# Patient Record
Sex: Female | Born: 1975
Health system: Southern US, Community
[De-identification: ages and names within clinical notes are randomized; demographics above are authoritative.]

## PROBLEM LIST (undated history)

## (undated) DIAGNOSIS — F1910 Other psychoactive substance abuse, uncomplicated: Secondary | ICD-10-CM

## (undated) DIAGNOSIS — M7989 Other specified soft tissue disorders: Secondary | ICD-10-CM

## (undated) DIAGNOSIS — Z8489 Family history of other specified conditions: Secondary | ICD-10-CM

## (undated) DIAGNOSIS — S02609A Fracture of mandible, unspecified, initial encounter for closed fracture: Secondary | ICD-10-CM

## (undated) HISTORY — PX: MANDIBLE FRACTURE SURGERY: SHX706

## (undated) HISTORY — DX: Fracture of mandible, unspecified, initial encounter for closed fracture: S02.609A

---

## 2004-02-10 ENCOUNTER — Emergency Department (HOSPITAL_COMMUNITY): Admission: EM | Admit: 2004-02-10 | Discharge: 2004-02-10 | Payer: Self-pay | Admitting: Emergency Medicine

## 2008-05-18 ENCOUNTER — Emergency Department: Payer: Self-pay | Admitting: Emergency Medicine

## 2008-10-04 ENCOUNTER — Emergency Department: Payer: Self-pay | Admitting: Emergency Medicine

## 2009-05-06 ENCOUNTER — Emergency Department: Payer: Self-pay | Admitting: Emergency Medicine

## 2009-12-13 ENCOUNTER — Inpatient Hospital Stay: Payer: Self-pay | Admitting: Obstetrics and Gynecology

## 2010-01-04 ENCOUNTER — Ambulatory Visit: Payer: Self-pay | Admitting: Specialist

## 2011-03-04 ENCOUNTER — Emergency Department: Payer: Self-pay | Admitting: Emergency Medicine

## 2015-04-28 ENCOUNTER — Emergency Department (HOSPITAL_COMMUNITY)
Admission: EM | Admit: 2015-04-28 | Discharge: 2015-04-28 | Discharge status: Against Medical Advice | Payer: 59 | Attending: Emergency Medicine | Admitting: Emergency Medicine

## 2015-04-28 ENCOUNTER — Encounter (HOSPITAL_COMMUNITY): Payer: Self-pay | Admitting: *Deleted

## 2015-04-28 ENCOUNTER — Emergency Department (HOSPITAL_COMMUNITY): Payer: 59

## 2015-04-28 DIAGNOSIS — F17200 Nicotine dependence, unspecified, uncomplicated: Secondary | ICD-10-CM | POA: Diagnosis not present

## 2015-04-28 DIAGNOSIS — R0600 Dyspnea, unspecified: Secondary | ICD-10-CM | POA: Diagnosis not present

## 2015-04-28 DIAGNOSIS — R0602 Shortness of breath: Secondary | ICD-10-CM | POA: Diagnosis present

## 2015-04-28 HISTORY — DX: Other specified soft tissue disorders: M79.89

## 2015-04-28 NOTE — ED Notes (Signed)
Pt requesting discharge, stating "I don't need blood, I think this is more related to my anxiety. Can we be given referral papers for family services/mental health services?" Will discuss w/ EDP

## 2015-04-28 NOTE — ED Notes (Signed)
Pt states yesterday was really stressful and has a lot of things going on.  PT feels sob, chest tightness, and racing heart rate.  Pt states just feels like she cannot breath, no distress

## 2015-04-28 NOTE — Discharge Instructions (Signed)
°Emergency Department Resource Guide °1) Find a Doctor and Pay Out of Pocket °Although you won't have to find out who is covered by your insurance plan, it is a good idea to ask around and get recommendations. You will then need to call the office and see if the doctor you have chosen will accept you as a new patient and what types of options they offer for patients who are self-pay. Some doctors offer discounts or will set up payment plans for their patients who do not have insurance, but you will need to ask so you aren't surprised when you get to your appointment. ° °2) Contact Your Local Health Department °Not all health departments have doctors that can see patients for sick visits, but many do, so it is worth a call to see if yours does. If you don't know where your local health department is, you can check in your phone book. The CDC also has a tool to help you locate your state's health department, and many state websites also have listings of all of their local health departments. ° °3) Find a Walk-in Clinic °If your illness is not likely to be very severe or complicated, you may want to try a walk in clinic. These are popping up all over the country in pharmacies, drugstores, and shopping centers. They're usually staffed by nurse practitioners or physician assistants that have been trained to treat common illnesses and complaints. They're usually fairly quick and inexpensive. However, if you have serious medical issues or chronic medical problems, these are probably not your best option. ° °No Primary Care Doctor: °- Call [facility] at  832-8000 - they can help you locate a primary care doctor that  accepts your insurance, provides certain services, etc. °- Physician Referral Service- 1-800-533-3463 ° °Chronic Pain Problems: °Organization         Address  Phone   Notes  ° [facility]  (336) 297-2271 Patients need to be referred by their primary care doctor.  ° °Medication  Assistance: °Organization         Address  Phone   Notes  °[place] [facility] [address] °[place], [place] [address] (336) 641-8030 --Must be a resident of [place] °-- Must have NO insurance coverage whatsoever (no Medicaid/ Medicare, etc.) °-- The pt. MUST have a primary care doctor that directs their care regularly and follows them in the community °  °[facility]  (866) 331-1348   °[employer]  (888) 892-1162   ° °Agencies that provide inexpensive medical care: °Organization         Address  Phone   Notes  °[facility]  (336) 832-8035   °[facility]    (336) 832-7272   °[facility] [address] °[place], [place] [address] (336) 832-4777   °[facility] [address], °[place] (336) 271-4999   °[facility]    (336) 373-0678   °[facility]    (336) 272-1050   °[facility] ° [address], [place] Phone:  (336) 832-4444, Fax:  (336) 832-4440 Hours of Operation:  9 am - 6 pm, M-F.  Also accepts Medicaid/Medicare and self-pay.  °[facility] ° [address], [place] Phone: (336) 832-3150, Fax: (336) 832-3151. Hours of Operation:  8:30 am - 5:30 pm, M-F.  Also accepts Medicaid and self-pay.  °[facility] [place] [address], [place] Phone: (336) 878-6027   °[facility] [address], [place], [place] (336)723-1848, Ext. 123 Mondays & Thursdays: 7-9 AM.  First 15 patients are seen on a first come, first serve basis. °  ° °Medicaid-accepting [place] Providers: ° °Organization         Address  Phone   Notes  °[facility] [address], [place] (336) 641-2100 Also accepts self-pay patients.  °[facility] [address], [place] ° (336) 856-9996   °[facility] [address], [place]  (336) 288-8857   °[facility] [address] [place] [address], [place] (336) 299-7000   °Veita Bland [address], [place]  ° (336) 373-1557 Only accepts [place] Access Medicaid patients after they have their name applied to their card.  ° °Self-Pay (no insurance) in [place]: ° °Organization         Address  Phone   Notes  °Sickle Cell Patients, [facility] [address], [place] (336) 832-1970   °[facility] [address], [place] (336) 832-4400   °[facility] [place] ° [address],  (336) 992-4800   °[facility]/Dr. Osei-Bonsu ° [address] [place] [address], [place] or [address], [place] (336) 841-8500 Phone number for both [place] and [place] locations is the same.  °[facility] [address], [place] (336) 299-0000   °[facility] [address] [place] [address], [place] or [address] (336) 852-7530 °(336) 878-2260   °[facility] [address], [place] (336) 350-1642, phone; (336) 294-5005, fax Sees patients 1st and 3rd Saturday of every month.  Must not qualify for public or private insurance (i.e. Medicaid, Medicare, [place] Health Choice, Veterans' Benefits) • Household income should be no more than 200% of the poverty level •The clinic cannot treat you if you are pregnant or think you are pregnant • Sexually transmitted diseases are not treated at the clinic.  ° ° °Dental Care: °Organization         Address  Phone  Notes  °[place] [facility] [address], [place] (336) 641-6152 Accepts children up to age 21 who are enrolled in Medicaid or [place] Health Choice; pregnant women with a Medicaid card; and children who have applied for Medicaid or [place] Health Choice, but were declined, whose parents can pay a reduced fee at time of service.  °[place]  [facility] [place]  [address], [place] (336) 641-7733 Accepts children up to age 21 who are enrolled in Medicaid or [place] Health Choice; pregnant women with a Medicaid card; and children who have applied for Medicaid or  Health Choice, but were declined, whose parents can pay a reduced fee at time of service.  °[facility] ° [address], [place] (336) 641-4533 Patients are seen by appointment only. Walk-ins are not accepted. [facility] will see patients 18 years of age and older. °Monday - Tuesday (8am-5pm) °Most Wednesdays (8:30-5pm) °$30 per visit, cash only  °[facility] ° [address], [place] (336) 641-4533 Patients are seen by appointment only. Walk-ins are not accepted. [facility] will see patients 18 years of age and older. °One   Wednesday Evening (Monthly: Volunteer Based).  $30 per visit, cash only  °[facility]  (919) 537-3737 for adults; Children under age 4, call Graduate Pediatric Dentistry at (919) [phone]. Children aged 4-14, please call (919) [phone] to request a pediatric application. ° Dental services are provided in all areas of dental care including fillings, crowns and bridges, complete and partial dentures, implants, gum treatment, root canals, and extractions. Preventive care is also provided. Treatment is provided to both adults and children. °Patients are selected via a lottery and there is often a waiting list. °  °[facility] [address], °[place] ° (336) 763-8833 [email or web address] °  °[facility] [address], [place], [place] (336)723-1848, Ext. 123 Second and Fourth Thursday of each month, opens at 6:30 AM; Clinic ends at 9 AM.  Patients are seen on a first-come first-served basis, and a limited number are seen during each clinic.  ° °[facility] ° [address], [place], [place] (336) 723-7904    Eligibility Requirements °You must have lived in [place], [place], or [place] counties for at least the last three months. °  You cannot be eligible for state or federal sponsored healthcare insurance, including Veterans Administration, Medicaid, or Medicare. °  You generally cannot be eligible for healthcare insurance through your employer.  °  How to apply: °Eligibility screenings are held every Tuesday and Wednesday afternoon from 1:00 pm until 4:00 pm. You do not need an appointment for the interview!  °[facility] [address], [place], [place] 336-631-2330   °[place] [facility]  336-342-8273   °[facility]  336-703-3100   °[facility]  336-570-6415   ° °Behavioral Health Resources in the Community: °Intensive Outpatient Programs °Organization         Address  Phone  Notes  °[facility] [address], [place], [place] 336-878-6098   °[facility] [address], [place], [place] 336-832-9800   °[facility] [address], [place], [place] ° 336-882-2125   °[facility] [address],  °[place], [place] 1-800-853-5163 or 336-641-4981   °Substance Abuse Resources °Organization         Address  Phone  Notes  °[facility]  336-882-2125   °[facility]  336-784-9470   °[facility]  336-285-9073   °[facility]  336-845-3988   °Residential & Outpatient Substance Abuse Program  1-800-659-3381   °Psychological Services °Organization         Address  Phone  Notes  °[facility]  336- 832-9600   °[facility]  336- 378-7881   °[facility] [address], [place] 1-800-853-5163 or 336-641-4981   ° °Mobile Crisis Teams °Organization         Address  Phone  Notes  °[facility], Mobile Crisis Care Unit  1-877-626-1772   °Assertive °[facility] ° [address]  [place],  336-834-9664   °Sharon DeEsch [address] °[place]  336-554-5454   ° °Self-Help/Support Groups °Organization         Address  Phone             Notes  °[facility] - variety of support groups  336- 373-1402 Call for more information  °Narcotics Anonymous (NA), [facility] [address], °[place]   2 meetings at this location  ° °  Residential Treatment Programs °Organization         Address  Phone  Notes  °[facility] [address],    °[place]  1-866-801-8205   °[facility] ° [address], [place], [place] 704-293-8524   °[facility] [address], [place] 336-845-3988 Admissions: 8am-3pm M-F  °[facility] [address]    °[place], [place] 336-841-1104   °[facility] [address], [place], [place] 336-379-7146   °[facility] [address]  °[place], [place] 336-285-9073   °[facility] - Intensive Outpatient [address], [place], [place] 336-852-3033   °[facility] [address]  °[place], [place] 1-877-615-2722 or 336-784-9470   °[facility] [address] [place], [place] 336-227-7417 Accepts Medicaid  °[facility] [address]  °[place] 1-800-659-3381 Substance Abuse/Addiction Treatment  ° °[place] Behavioral Health Resources °Organization         Address  Phone  Notes  °[facility]  (888) 581-9988   °Julie Brannon, PhD [address] [place], [place]   (336) 349-5553 or (336) 951-0000   °[facility]   [address] °[place], [place] (336) 349-4454   °[facility] [address], [place], [place] (336) 342-8316 Insurance/Medicaid/sponsorship through [facility]  °[facility] [address]                                    [place], [place] (336) 342-8316 Therapy/tele-psych/case    °[facility] [address]  ° [place], [place] (336) 349-2233    °Dr. Arfeen  (336) 349-4544   °[facility] [place]  [employer] [place] [facility] 1) [address],  °2) [address], [place] °3)  [address], [place] (336) 349-3220 °(336) 342-7768 ° °(336) 342-8140   °[place] [facility] (336) 342-1394 or (336) 342-3537 (After Hours)    ° ° °

## 2015-04-28 NOTE — ED Notes (Signed)
Pt verbalized understanding of leaving without further examination/tx. No further questions/concerns, VSS, will return w/ any complications.

## 2015-12-11 ENCOUNTER — Encounter: Payer: Self-pay | Admitting: Emergency Medicine

## 2015-12-11 ENCOUNTER — Emergency Department: Payer: BLUE CROSS/BLUE SHIELD

## 2015-12-11 ENCOUNTER — Emergency Department
Admission: EM | Admit: 2015-12-11 | Discharge: 2015-12-12 | Payer: BLUE CROSS/BLUE SHIELD | Attending: Emergency Medicine | Admitting: Emergency Medicine

## 2015-12-11 DIAGNOSIS — M264 Malocclusion, unspecified: Secondary | ICD-10-CM | POA: Insufficient documentation

## 2015-12-11 DIAGNOSIS — S02609A Fracture of mandible, unspecified, initial encounter for closed fracture: Secondary | ICD-10-CM

## 2015-12-11 DIAGNOSIS — W500XXA Accidental hit or strike by another person, initial encounter: Secondary | ICD-10-CM | POA: Diagnosis not present

## 2015-12-11 DIAGNOSIS — S02602A Fracture of unspecified part of body of left mandible, initial encounter for closed fracture: Secondary | ICD-10-CM | POA: Diagnosis not present

## 2015-12-11 DIAGNOSIS — Z87891 Personal history of nicotine dependence: Secondary | ICD-10-CM | POA: Diagnosis not present

## 2015-12-11 DIAGNOSIS — Y929 Unspecified place or not applicable: Secondary | ICD-10-CM | POA: Insufficient documentation

## 2015-12-11 DIAGNOSIS — Y9389 Activity, other specified: Secondary | ICD-10-CM | POA: Diagnosis not present

## 2015-12-11 DIAGNOSIS — Y999 Unspecified external cause status: Secondary | ICD-10-CM | POA: Insufficient documentation

## 2015-12-11 DIAGNOSIS — Z79899 Other long term (current) drug therapy: Secondary | ICD-10-CM | POA: Insufficient documentation

## 2015-12-11 DIAGNOSIS — Z79891 Long term (current) use of opiate analgesic: Secondary | ICD-10-CM | POA: Diagnosis not present

## 2015-12-11 DIAGNOSIS — S0993XA Unspecified injury of face, initial encounter: Secondary | ICD-10-CM | POA: Diagnosis present

## 2015-12-11 MED ORDER — TRAMADOL HCL 50 MG PO TABS
50.0000 mg | ORAL_TABLET | Freq: Once | ORAL | Status: AC
  Administered 2015-12-11: 50 mg via ORAL
  Filled 2015-12-11: qty 1

## 2015-12-11 MED ORDER — IBUPROFEN 600 MG PO TABS
600.0000 mg | ORAL_TABLET | Freq: Once | ORAL | Status: AC
  Administered 2015-12-11: 600 mg via ORAL
  Filled 2015-12-11: qty 1

## 2015-12-11 MED ORDER — HYDROMORPHONE HCL 1 MG/ML IJ SOLN
1.0000 mg | Freq: Once | INTRAMUSCULAR | Status: AC
  Administered 2015-12-11: 1 mg via INTRAMUSCULAR

## 2015-12-11 MED ORDER — HYDROMORPHONE HCL 1 MG/ML IJ SOLN
INTRAMUSCULAR | Status: AC
  Administered 2015-12-11: 1 mg via INTRAMUSCULAR
  Filled 2015-12-11: qty 1

## 2015-12-11 NOTE — ED Provider Notes (Signed)
[facility] Emergency Department Provider Note   ____________________________________________  Time seen: Approximately 8:37 PM  I have reviewed the triage vital signs and the nursing notes.   HISTORY  Chief Complaint Mouth Injury    HPI Kimberly Buchanan is a 40 y.o. female patient complaining of left jaw pain secondary to blunt trauma. Patient states she was behind her husband at least plan with the dog and when he pulled by. His elbow hit her on the left side of the jaw. Patient unable to open her mouth fully without complain of pain. Patient now has to 2 inferior molars that are sitting higher then the rest of her teeth.  Patient rates pain as a 10 over 10. Past Medical History  Diagnosis Date  . Hand swelling     There are no active problems to display for this patient.   History reviewed. No pertinent past surgical history.  Current Outpatient Rx  Name  Route  Sig  Dispense  Refill  . chlorthalidone (HYGROTON) 25 MG tablet   Oral   Take 1 tablet by mouth daily.         . SUBOXONE 8-2 MG FILM   Sublingual   Place 1 strip under the tongue 2 (two) times daily. Use 1 strip twice daily (max dose of 2.5 strips)      0     Dispense as written.     Allergies Bactrim and Sulfa antibiotics  History reviewed. No pertinent family history.  Social History Social History  Substance Use Topics  . Smoking status: Former Games developer  . Smokeless tobacco: None  . Alcohol Use: No    Review of Systems Constitutional: No fever/chills Eyes: No visual changes. ENT: No sore throat. Left jaw pain Cardiovascular: Denies chest pain. Respiratory: Denies shortness of breath. Gastrointestinal: No abdominal pain.  No nausea, no vomiting.  No diarrhea.  No constipation. Genitourinary: Negative for dysuria. Musculoskeletal: Negative for back pain. Skin: Negative for rash. Neurological: Negative for headaches, focal weakness or  numbness. Allergic/Immunilogical: Sulfa antibiotics __________________________________________   PHYSICAL EXAM:  VITAL SIGNS: ED Triage Vitals  Enc Vitals Group     BP --      Pulse --      Resp --      Temp --      Temp src --      SpO2 --      Weight --      Height --      Head Cir --      Peak Flow --      Pain Score --      Pain Loc --      Pain Edu? --      Excl. in GC? --     Constitutional: Alert and oriented. Well appearing and in no acute distress. Eyes: Conjunctivae are normal. PERRL. EOMI. Head: Atraumatic. Nose: No congestion/rhinnorhea. Mouth/Throat: Mucous membranes are moist.  Oropharynx non-erythematous. Left mandible malocclusion. Neck: No stridor. No cervical spine tenderness to palpation. Hematological/Lymphatic/Immunilogical: No cervical lymphadenopathy. Cardiovascular: Normal rate, regular rhythm. Grossly normal heart sounds.  Good peripheral circulation. Respiratory: Normal respiratory effort.  No retractions. Lungs CTAB. Gastrointestinal: Soft and nontender. No distention. No abdominal bruits. No CVA tenderness. Musculoskeletal: No lower extremity tenderness nor edema.  No joint effusions. Neurologic:  Normal speech and language. No gross focal neurologic deficits are appreciated. No gait instability. Skin:  Skin is warm, dry and intact. No rash noted. Psychiatric: Mood and affect are normal.  Speech and behavior are normal.  ____________________________________________   LABS (all labs ordered are listed, but only abnormal results are displayed)  Labs Reviewed - No data to display ____________________________________________  EKG   ____________________________________________  RADIOLOGY  Acute oblique fracture of the left mandible body with malocclusion. There is presumed mucosal laceration with extensive submandible gas. ____________________________________________   PROCEDURES  Procedure(s) performed: None  Procedures  Critical  Care performed: No  ____________________________________________   INITIAL IMPRESSION / ASSESSMENT AND PLAN / ED COURSE  Pertinent labs & imaging results that were available during my care of the patient were reviewed by me and considered in my medical decision making (see chart for details).  Acute oblique fracture left mandible body. [facility] for [facility] care.____________________________________________   FINAL CLINICAL IMPRESSION(S) / ED DIAGNOSES  Final diagnoses:  Fracture of left side of mandible (HCC)      NEW MEDICATIONS STARTED DURING THIS VISIT:  New Prescriptions   No medications on file     Note:  This document was prepared using Dragon voice recognition software and may include unintentional dictation errors.    Joni Reiningonald K Kerri Asche, PA-C 12/12/15 0020  Myrna Blazeravid Matthew Schaevitz, MD 12/15/15 [phone]

## 2015-12-11 NOTE — ED Notes (Signed)
Pt's ice bag changed to fresh ice per pt request.

## 2015-12-11 NOTE — ED Notes (Signed)
Pt taken to CT via stretcher.

## 2015-12-11 NOTE — ED Notes (Signed)
Pt says she was outside with husband, playing with their dog, when her husband pulled back his arm forcefully and accidentally elbowed pt on the left side of her mouth; back 2 teeth on the bottom left side are now sitting higher up than the rest of her teeth; pt is unable to open her mouth all the way; concerned she has fractured her jaw

## 2015-12-11 NOTE — ED Notes (Signed)
Pt ambulatory to toilet and back.  

## 2015-12-12 MED ORDER — HYDROMORPHONE HCL 1 MG/ML IJ SOLN
1.0000 mg | Freq: Once | INTRAMUSCULAR | Status: AC
  Administered 2015-12-12: 1 mg via INTRAMUSCULAR
  Filled 2015-12-12: qty 1

## 2015-12-12 MED ORDER — ONDANSETRON 4 MG PO TBDP
4.0000 mg | ORAL_TABLET | Freq: Once | ORAL | Status: AC
  Administered 2015-12-12: 4 mg via ORAL
  Filled 2015-12-12: qty 1

## 2015-12-12 MED ORDER — CLINDAMYCIN HCL 150 MG PO CAPS
300.0000 mg | ORAL_CAPSULE | Freq: Once | ORAL | Status: AC
  Administered 2015-12-12: 300 mg via ORAL
  Filled 2015-12-12: qty 2

## 2015-12-12 MED ORDER — DEXAMETHASONE SODIUM PHOSPHATE 10 MG/ML IJ SOLN
10.0000 mg | Freq: Once | INTRAMUSCULAR | Status: AC
  Administered 2015-12-12: 10 mg via INTRAMUSCULAR
  Filled 2015-12-12: qty 1

## 2015-12-12 NOTE — ED Notes (Signed)
[name] to give [facility] a medication update and to inform her pt just left this ED with [facility]

## 2015-12-12 NOTE — ED Notes (Signed)
Second attempt at IV access unsuccessful; pt tolerated well

## 2015-12-12 NOTE — ED Notes (Signed)
[facility]

## 2015-12-12 NOTE — ED Notes (Signed)
Pt moved to 1H to wait for transport; pain increasing, requested pain medication; verbal orders given by Christiane HaJonathan, [place]

## 2015-12-12 NOTE — ED Notes (Signed)
Attempt at IV access to right AC unsuccessful; pt tolerated well;

## 2016-02-16 ENCOUNTER — Ambulatory Visit (INDEPENDENT_AMBULATORY_CARE_PROVIDER_SITE_OTHER)
Admission: RE | Admit: 2016-02-16 | Discharge: 2016-02-16 | Disposition: A | Discharge status: Home / Self Care | Payer: BLUE CROSS/BLUE SHIELD | Source: Ambulatory Visit | Attending: Internal Medicine | Admitting: Internal Medicine

## 2016-02-16 ENCOUNTER — Encounter: Payer: Self-pay | Admitting: Internal Medicine

## 2016-02-16 ENCOUNTER — Ambulatory Visit (INDEPENDENT_AMBULATORY_CARE_PROVIDER_SITE_OTHER): Payer: BLUE CROSS/BLUE SHIELD | Admitting: Internal Medicine

## 2016-02-16 VITALS — BP 100/60 | HR 48 | Temp 98.4°F | Ht 70.0 in | Wt 156.0 lb

## 2016-02-16 DIAGNOSIS — M542 Cervicalgia: Secondary | ICD-10-CM

## 2016-02-16 DIAGNOSIS — S02609G Fracture of mandible, unspecified, subsequent encounter for fracture with delayed healing: Secondary | ICD-10-CM | POA: Diagnosis not present

## 2016-02-16 DIAGNOSIS — R29898 Other symptoms and signs involving the musculoskeletal system: Secondary | ICD-10-CM

## 2016-02-16 DIAGNOSIS — F1110 Opioid abuse, uncomplicated: Secondary | ICD-10-CM

## 2016-02-16 DIAGNOSIS — F1111 Opioid abuse, in remission: Secondary | ICD-10-CM | POA: Insufficient documentation

## 2016-02-16 DIAGNOSIS — R200 Anesthesia of skin: Secondary | ICD-10-CM

## 2016-02-16 DIAGNOSIS — Z23 Encounter for immunization: Secondary | ICD-10-CM | POA: Diagnosis not present

## 2016-02-16 DIAGNOSIS — R208 Other disturbances of skin sensation: Secondary | ICD-10-CM

## 2016-02-16 DIAGNOSIS — F32A Depression, unspecified: Secondary | ICD-10-CM | POA: Insufficient documentation

## 2016-02-16 DIAGNOSIS — S02609A Fracture of mandible, unspecified, initial encounter for closed fracture: Secondary | ICD-10-CM | POA: Insufficient documentation

## 2016-02-16 DIAGNOSIS — M7989 Other specified soft tissue disorders: Secondary | ICD-10-CM

## 2016-02-16 DIAGNOSIS — F329 Major depressive disorder, single episode, unspecified: Secondary | ICD-10-CM

## 2016-02-16 NOTE — Patient Instructions (Signed)
Cervical Sprain  A cervical sprain is an injury in the neck in which the strong, fibrous tissues (ligaments) that connect your neck bones stretch or tear. Cervical sprains can range from mild to severe. Severe cervical sprains can cause the neck vertebrae to be unstable. This can lead to damage of the spinal cord and can result in serious nervous system problems. The amount of time it takes for a cervical sprain to get better depends on the cause and extent of the injury. Most cervical sprains heal in 1 to 3 weeks.  CAUSES   Severe cervical sprains may be caused by:    Contact sport injuries (such as from football, rugby, wrestling, hockey, auto racing, gymnastics, diving, martial arts, or boxing).    Motor vehicle collisions.    Whiplash injuries. This is an injury from a sudden forward and backward whipping movement of the head and neck.   Falls.   Mild cervical sprains may be caused by:    Being in an awkward position, such as while cradling a telephone between your ear and shoulder.    Sitting in a chair that does not offer proper support.    Working at a poorly designed computer station.    Looking up or down for long periods of time.   SYMPTOMS    Pain, soreness, stiffness, or a burning sensation in the front, back, or sides of the neck. This discomfort may develop immediately after the injury or slowly, 24 hours or more after the injury.    Pain or tenderness directly in the middle of the back of the neck.    Shoulder or upper back pain.    Limited ability to move the neck.    Headache.    Dizziness.    Weakness, numbness, or tingling in the hands or arms.    Muscle spasms.    Difficulty swallowing or chewing.    Tenderness and swelling of the neck.   DIAGNOSIS   Most of the time your health care provider can diagnose a cervical sprain by taking your history and doing a physical exam. Your health care provider will ask about previous neck injuries and any known neck  problems, such as arthritis in the neck. X-rays may be taken to find out if there are any other problems, such as with the bones of the neck. Other tests, such as a CT scan or MRI, may also be needed.   TREATMENT   Treatment depends on the severity of the cervical sprain. Mild sprains can be treated with rest, keeping the neck in place (immobilization), and pain medicines. Severe cervical sprains are immediately immobilized. Further treatment is done to help with pain, muscle spasms, and other symptoms and may include:   Medicines, such as pain relievers, numbing medicines, or muscle relaxants.    Physical therapy. This may involve stretching exercises, strengthening exercises, and posture training. Exercises and improved posture can help stabilize the neck, strengthen muscles, and help stop symptoms from returning.   HOME CARE INSTRUCTIONS    Put ice on the injured area.     Put ice in a plastic bag.     Place a towel between your skin and the bag.     Leave the ice on for 15-20 minutes, 3-4 times a day.    If your injury was severe, you may have been given a cervical collar to wear. A cervical collar is a two-piece collar designed to keep your neck from moving while it heals.      Do not remove the collar unless instructed by your health care provider.    If you have long hair, keep it outside of the collar.    Ask your health care provider before making any adjustments to your collar. Minor adjustments may be required over time to improve comfort and reduce pressure on your chin or on the back of your head.    Ifyou are allowed to remove the collar for cleaning or bathing, follow your health care provider's instructions on how to do so safely.    Keep your collar clean by wiping it with mild soap and water and drying it completely. If the collar you have been given includes removable pads, remove them every 1-2 days and hand wash them with soap and water. Allow them to air dry. They should be completely  dry before you wear them in the collar.    If you are allowed to remove the collar for cleaning and bathing, wash and dry the skin of your neck. Check your skin for irritation or sores. If you see any, tell your health care provider.    Do not drive while wearing the collar.    Only take over-the-counter or prescription medicines for pain, discomfort, or fever as directed by your health care provider.    Keep all follow-up appointments as directed by your health care provider.    Keep all physical therapy appointments as directed by your health care provider.    Make any needed adjustments to your workstation to promote good posture.    Avoid positions and activities that make your symptoms worse.    Warm up and stretch before being active to help prevent problems.   SEEK MEDICAL CARE IF:    Your pain is not controlled with medicine.    You are unable to decrease your pain medicine over time as planned.    Your activity level is not improving as expected.   SEEK IMMEDIATE MEDICAL CARE IF:    You develop any bleeding.   You develop stomach upset.   You have signs of an allergic reaction to your medicine.    Your symptoms get worse.    You develop new, unexplained symptoms.    You have numbness, tingling, weakness, or paralysis in any part of your body.   MAKE SURE YOU:    Understand these instructions.   Will watch your condition.   Will get help right away if you are not doing well or get worse.     This information is not intended to replace advice given to you by your health care provider. Make sure you discuss any questions you have with your health care provider.     Document Released: 03/11/2007 Document Revised: 05/19/2013 Document Reviewed: 11/19/2012  Elsevier Interactive Patient Education 2016 [employer]

## 2016-02-16 NOTE — Addendum Note (Signed)
Addended by: Barrington EllisonWAGONER, Syrena Burges C on: 02/16/2016 09:16 AM   Modules accepted: Orders

## 2016-02-16 NOTE — Assessment & Plan Note (Signed)
Congratulated her on her sobriety Continue Suboxone
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
## 2016-02-16 NOTE — Assessment & Plan Note (Signed)
Has CT scan scheduled for tomorrow She [facility]

## 2016-02-16 NOTE — Assessment & Plan Note (Signed)
Will review records from [facility] to see what workup she had done Continue Clorthalidone for now

## 2016-02-16 NOTE — Progress Notes (Signed)
Pre visit review using our clinic review tool, if applicable. No additional management support is needed unless otherwise documented below in the visit note. 

## 2016-02-20 ENCOUNTER — Other Ambulatory Visit: Payer: Self-pay | Admitting: Internal Medicine

## 2016-02-20 DIAGNOSIS — R29898 Other symptoms and signs involving the musculoskeletal system: Secondary | ICD-10-CM

## 2016-02-20 DIAGNOSIS — M542 Cervicalgia: Secondary | ICD-10-CM

## 2016-03-05 ENCOUNTER — Ambulatory Visit: Payer: BLUE CROSS/BLUE SHIELD

## 2016-03-14 ENCOUNTER — Ambulatory Visit
Admission: RE | Admit: 2016-03-14 | Discharge: 2016-03-14 | Disposition: A | Discharge status: Home / Self Care | Payer: BLUE CROSS/BLUE SHIELD | Source: Ambulatory Visit | Attending: Internal Medicine | Admitting: Internal Medicine

## 2016-03-14 DIAGNOSIS — M50323 Other cervical disc degeneration at C6-C7 level: Secondary | ICD-10-CM | POA: Diagnosis not present

## 2016-03-14 DIAGNOSIS — M4802 Spinal stenosis, cervical region: Secondary | ICD-10-CM | POA: Diagnosis not present

## 2016-03-14 DIAGNOSIS — M50322 Other cervical disc degeneration at C5-C6 level: Secondary | ICD-10-CM | POA: Diagnosis not present

## 2016-03-14 DIAGNOSIS — R29898 Other symptoms and signs involving the musculoskeletal system: Secondary | ICD-10-CM

## 2016-03-14 DIAGNOSIS — M542 Cervicalgia: Secondary | ICD-10-CM

## 2016-03-14 DIAGNOSIS — M50321 Other cervical disc degeneration at C4-C5 level: Secondary | ICD-10-CM | POA: Insufficient documentation

## 2016-04-05 DIAGNOSIS — S02650G Fracture of angle of mandible, unspecified side, subsequent encounter for fracture with delayed healing: Secondary | ICD-10-CM | POA: Insufficient documentation

## 2016-08-15 ENCOUNTER — Encounter (INDEPENDENT_AMBULATORY_CARE_PROVIDER_SITE_OTHER): Payer: Self-pay

## 2016-08-15 ENCOUNTER — Encounter: Payer: Self-pay | Admitting: Family Medicine

## 2016-08-15 ENCOUNTER — Other Ambulatory Visit: Payer: Self-pay | Admitting: Internal Medicine

## 2016-08-15 ENCOUNTER — Ambulatory Visit (INDEPENDENT_AMBULATORY_CARE_PROVIDER_SITE_OTHER): Payer: BLUE CROSS/BLUE SHIELD | Admitting: Family Medicine

## 2016-08-15 DIAGNOSIS — M461 Sacroiliitis, not elsewhere classified: Secondary | ICD-10-CM | POA: Diagnosis not present

## 2016-08-15 MED ORDER — PREDNISONE 20 MG PO TABS: ORAL_TABLET | ORAL | 0 refills | Status: DC

## 2016-08-15 MED ORDER — CHLORTHALIDONE 25 MG PO TABS: 25.0000 mg | ORAL_TABLET | Freq: Every day | ORAL | 3 refills | Status: DC

## 2016-08-15 NOTE — Assessment & Plan Note (Signed)
Prednisone- 60 mg daily x 7 days Exercises given from sports med advisor. Call or return to clinic prn if these symptoms worsen or fail to improve as anticipated. The patient indicates understanding of these issues and agrees with the plan.

## 2016-08-15 NOTE — Progress Notes (Signed)
   Subjective:   Patient ID: Kimberly Buchanan, female    DOB: [birth year], 41 y.o.   MRN: [id number]  Cruzita Cruzita LedererLedererlizabeth L Brisbane is a pleasant 41 y.o. year old female complicated pt of Nicki ReaperRegina Baity, new to me, who presents to clinic today with Hip Pain (Pt stated hip and leg been painful for about 1 week)  on 08/15/2016  HPI:  Chart reviewed. She is on Suboxone for h/o heroin abuse- has been clean for over 4 years now.  One week ago- acute onset of right buttocks pain, radiates down leg.  No known injury but it is worse if she has been sitting for long periods of time.  No LE weakness or numbness.  She is a runner.   Current Outpatient Prescriptions on File Prior to Visit  Medication Sig Dispense Refill  . SUBOXONE 8-2 MG FILM Place 1 strip under the tongue 2 (two) times daily. Use 1 strip twice daily (max dose of 2.5 strips)  0   No current facility-administered medications on file prior to visit.     Allergies  Allergen Reactions  . Bactrim [Sulfamethoxazole-Trimethoprim]   . Sulfa Antibiotics     Past Medical History:  Diagnosis Date  . Broken jaw (HCC)   . Hand swelling     No past surgical history on file.  Family History  Problem Relation Age of Onset  . Hypertension Mother   . Arthritis Maternal Grandmother   . Cancer Maternal Grandmother     lung  . Hypertension Maternal Grandfather     Social History   Social History  . Marital status: Married    Spouse name: N/A  . Number of children: N/A  . Years of education: N/A   Occupational History  . Not on file.   Social History Main Topics  . Smoking status: Former Games developermoker  . Smokeless tobacco: Never Used  . Alcohol use No  . Drug use: No  . Sexual activity: No   Other Topics Concern  . Not on file   Social History Narrative  . No narrative on file   The PMH, PSH, Social History, Family History, Medications, and allergies have been reviewed in [facility], and have been updated if relevant.    Review of  Systems  Neurological: Positive for numbness.  All other systems reviewed and are negative.      Objective:    BP 122/86   Pulse 88   Temp 97.6 F (36.4 C)   Ht 5\' 10"  (1.778 m)   Wt 159 lb (72.1 kg)   SpO2 98%   BMI 22.81 kg/m    Physical Exam  Constitutional: She is oriented to person, place, and time. She appears well-developed and well-nourished. No distress.  HENT:  Head: Normocephalic and atraumatic.  Eyes: Conjunctivae are normal.  Cardiovascular: Normal rate.   Pulmonary/Chest: Effort normal.  Musculoskeletal:  TTP over right SI joint.  Neurological: She is alert and oriented to person, place, and time.  Skin: Skin is warm and dry. She is not diaphoretic.  Psychiatric: She has a normal mood and affect. Her behavior is normal. Judgment and thought content normal.  Nursing note and vitals reviewed.         Assessment & Plan:   SI (sacroiliac) joint inflammation (HCC) No Follow-up on file.

## 2016-08-21 ENCOUNTER — Telehealth: Payer: Self-pay

## 2016-08-21 NOTE — Telephone Encounter (Signed)
Left detailed msg on VM per HIPAA Requesting instructions and dose

## 2016-08-21 NOTE — Telephone Encounter (Signed)
-----   Message from Lorre Munroeegina W Baity, NP sent at 08/15/2016  1:36 PM EDT ----- Call pt:  Acyclovir is not on current or historical med list. Can she tell me her dose and instructions? ----- Message ----- From: Dianne Dunalia M Aron, MD Sent: 08/15/2016   1:07 PM To: Lorre Munroeegina W Baity, NP  Pleasant patient.  Seeing her today for back/SI joint issues. She is asking for refill of acyclovir for HSV I. Thanks!

## 2016-08-22 MED ORDER — VALACYCLOVIR HCL 1 G PO TABS: 1000.0000 mg | ORAL_TABLET | Freq: Every day | ORAL | 2 refills | Status: DC | PRN

## 2016-08-22 NOTE — Addendum Note (Signed)
Addended by: Lorre MunroeBAITY, Keven Osborn W on: 08/22/2016 11:05 AM   Modules accepted: Orders

## 2016-08-22 NOTE — Telephone Encounter (Signed)
Pt returned call. She states the instructions are:  1 gram tablet   Take 1 tab po QD for 5 days  Thanks

## 2016-08-22 NOTE — Telephone Encounter (Signed)
Pt is aware as instructed 

## 2016-08-22 NOTE — Telephone Encounter (Signed)
Medication sent to [place] [facility] 

## 2016-09-12 ENCOUNTER — Ambulatory Visit: Payer: BLUE CROSS/BLUE SHIELD | Admitting: Internal Medicine

## 2016-10-05 ENCOUNTER — Encounter: Payer: Self-pay | Admitting: Primary Care

## 2016-10-05 ENCOUNTER — Ambulatory Visit (INDEPENDENT_AMBULATORY_CARE_PROVIDER_SITE_OTHER): Payer: BLUE CROSS/BLUE SHIELD | Admitting: Primary Care

## 2016-10-05 VITALS — BP 120/74 | HR 67 | Temp 98.4°F | Ht 70.0 in | Wt 160.8 lb

## 2016-10-05 DIAGNOSIS — R350 Frequency of micturition: Secondary | ICD-10-CM

## 2016-10-05 DIAGNOSIS — N3001 Acute cystitis with hematuria: Secondary | ICD-10-CM

## 2016-10-05 LAB — POC URINALSYSI DIPSTICK (AUTOMATED)
Bilirubin, UA: NEGATIVE
GLUCOSE UA: NEGATIVE
KETONES UA: NEGATIVE
Nitrite, UA: NEGATIVE
Protein, UA: NEGATIVE
UROBILINOGEN UA: NEGATIVE U/dL — AB
pH, UA: 6.5 (ref 5.0–8.0)

## 2016-10-05 MED ORDER — CEPHALEXIN 500 MG PO CAPS: 500.0000 mg | ORAL_CAPSULE | Freq: Two times a day (BID) | ORAL | 0 refills | Status: DC

## 2016-10-05 NOTE — Addendum Note (Signed)
Addended by: Tawnya CrookSAMBATH, Loletta Harper on: 10/05/2016 10:26 AM   Modules accepted: Orders

## 2016-10-05 NOTE — Progress Notes (Signed)
Pre visit review using our clinic review tool, if applicable. No additional management support is needed unless otherwise documented below in the visit note. 

## 2016-10-05 NOTE — Patient Instructions (Signed)
Start Cephalexin antibiotics for urinary tract infection. Take 1 capsule by mouth twice daily for 7 days.  Continue AZO as needed.  Ensure you are staying hydrated and rest.  It was a pleasure meeting you!

## 2016-10-05 NOTE — Progress Notes (Signed)
   Subjective:    Patient ID: Kimberly Buchanan, female    DOB: [birth year], 41 y.o.   MRN: [id number]  HPI  Kimberly Buchanan is a 41 year old female who presents today with a chief complaint of urinary frequency. She also reports dysuria, low back pain. Her symptoms began around 10 days ago. She's been taking AZO with some improvement. She denies vaginal symptoms, hematuria, nausea, abdominal pain.   Review of Systems  Constitutional: Negative for fever.  Gastrointestinal: Negative for abdominal pain and nausea.  Genitourinary: Positive for dysuria, flank pain and frequency. Negative for hematuria, pelvic pain and vaginal discharge.       Past Medical History:  Diagnosis Date  . Broken jaw (HCC)   . Hand swelling      Social History   Social History  . Marital status: Married    Spouse name: N/A  . Number of children: N/A  . Years of education: N/A   Occupational History  . Not on file.   Social History Main Topics  . Smoking status: Former Games developermoker  . Smokeless tobacco: Never Used  . Alcohol use No  . Drug use: No  . Sexual activity: No   Other Topics Concern  . Not on file   Social History Narrative  . No narrative on file    No past surgical history on file.  Family History  Problem Relation Age of Onset  . Hypertension Mother   . Arthritis Maternal Grandmother   . Cancer Maternal Grandmother        lung  . Hypertension Maternal Grandfather     Allergies  Allergen Reactions  . Bactrim [Sulfamethoxazole-Trimethoprim]   . Sulfa Antibiotics     Current Outpatient Prescriptions on File Prior to Visit  Medication Sig Dispense Refill  . chlorthalidone (HYGROTON) 25 MG tablet Take 1 tablet (25 mg total) by mouth daily. 30 tablet 3  . SUBOXONE 8-2 MG FILM Place 1 strip under the tongue 2 (two) times daily. Use 1 strip twice daily (max dose of 2.5 strips)  0  . valACYclovir (VALTREX) 1000 MG tablet Take 1 tablet (1,000 mg total) by mouth daily as needed. 30  tablet 2   No current facility-administered medications on file prior to visit.     BP 120/74   Pulse 67   Temp 98.4 F (36.9 C) (Oral)   Ht 5\' 10"  (1.778 m)   Wt 160 lb 12.8 oz (72.9 kg)   SpO2 97%   BMI 23.07 kg/m    Objective:   Physical Exam  Constitutional: She appears well-nourished. She does not appear ill.  Neck: Neck supple.  Cardiovascular: Normal rate and regular rhythm.   Pulmonary/Chest: Effort normal and breath sounds normal.  Abdominal: There is no CVA tenderness.  Skin: Skin is warm and dry.          Assessment & Plan:  Urinary Frequency:  Also with dysuria and flank pain.  Exam today stable.  UA: 3+ leuks, 3+ blood. Culture sent. Given symptoms with presence of leuks will treat. Rx for Cephalexin course sent to pharmacy.  Continue AZO, fluids, rest, follow up PRN.  Morrie Sheldonlark,Britanny Marksberry Kendal, NP

## 2016-10-08 LAB — URINE CULTURE

## 2016-10-11 ENCOUNTER — Telehealth: Payer: Self-pay | Admitting: *Deleted

## 2016-10-11 MED ORDER — FLUCONAZOLE 150 MG PO TABS: 150.0000 mg | ORAL_TABLET | Freq: Once | ORAL | 0 refills | Status: AC

## 2016-10-11 NOTE — Telephone Encounter (Signed)
Patient left a voicemail stating that she has been on an antibiotic for a UTI and now needs Diflucan called in. Patient stated that antibiotics mess her up every time. Pharmacy-[place]

## 2016-10-11 NOTE — Addendum Note (Signed)
Addended by: Lorre MunroeBAITY, REGINA W on: 10/11/2016 02:46 PM   Modules accepted: Orders

## 2016-10-11 NOTE — Telephone Encounter (Signed)
Diflucan sent to [facility]. 

## 2016-12-18 ENCOUNTER — Telehealth: Payer: Self-pay

## 2016-12-18 MED ORDER — MELOXICAM 15 MG PO TABS: 15.0000 mg | ORAL_TABLET | Freq: Every day | ORAL | 5 refills | Status: DC

## 2016-12-18 NOTE — Addendum Note (Signed)
Addended by: Lorre MunroeBAITY, Kyce Ging W on: 12/18/2016 11:25 AM   Modules accepted: Orders
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
## 2016-12-18 NOTE — Telephone Encounter (Signed)
Medication sent to pharmacy per request 

## 2016-12-24 ENCOUNTER — Ambulatory Visit (INDEPENDENT_AMBULATORY_CARE_PROVIDER_SITE_OTHER): Payer: BLUE CROSS/BLUE SHIELD | Admitting: Internal Medicine

## 2016-12-24 ENCOUNTER — Encounter: Payer: Self-pay | Admitting: Internal Medicine

## 2016-12-24 VITALS — BP 118/68 | HR 56 | Temp 98.6°F | Wt 164.0 lb

## 2016-12-24 DIAGNOSIS — M461 Sacroiliitis, not elsewhere classified: Secondary | ICD-10-CM

## 2016-12-24 MED ORDER — PREDNISONE 10 MG PO TABS: ORAL_TABLET | ORAL | 0 refills | Status: DC

## 2016-12-24 NOTE — Patient Instructions (Signed)
Sacroiliac (SI) Joint Injection Patient Information  Description: The sacroiliac joint connects the scrum (very low back and tailbone) to the ilium (a pelvic bone which also forms half of the hip joint).  Normally this joint experiences very little motion.  When this joint becomes inflamed or unstable low back and or hip and pelvis pain may result.  Injection of this joint with local anesthetics (numbing medicines) and steroids can provide diagnostic information and reduce pain.  This injection is performed with the aid of x-ray guidance into the tailbone area while you are lying on your stomach.   You may experience an electrical sensation down the leg while this is being done.  You may also experience numbness.  We also may ask if we are reproducing your normal pain during the injection.  Conditions which may be treated SI injection:   Low back, buttock, hip or leg pain  Preparation for the Injection:  1. Do not eat any solid food or dairy products within 8 hours of your appointment.  2. You may drink clear liquids up to 3 hours before appointment.  Clear liquids include water, black coffee, juice or soda.  No milk or cream please. 3. You may take your regular medications, including pain medications with a sip of water before your appointment.  Diabetics should hold regular insulin (if take separately) and take 1/2 normal NPH dose the morning of the procedure.  Carry some sugar containing items with you to your appointment. 4. A driver must accompany you and be prepared to drive you home after your procedure. 5. Bring all of your current medications with you. 6. An IV may be inserted and sedation may be given at the discretion of the physician. 7. A blood pressure cuff, EKG and other monitors will often be applied during the procedure.  Some patients may need to have extra oxygen administered for a short period.  8. You will be asked to provide medical information, including your allergies,  prior to the procedure.  We must know immediately if you are taking blood thinners (like Coumadin/Warfarin) or if you are allergic to IV iodine contrast (dye).  We must know if you could possible be pregnant.  Possible side effects:   Bleeding from needle site  Infection (rare, may require surgery)  Nerve injury (rare)  Numbness & tingling (temporary)  A brief convulsion or seizure  Light-headedness (temporary)  Pain at injection site (several days)  Decreased blood pressure (temporary)  Weakness in the leg (temporary)   Call if you experience:   New onset weakness or numbness of an extremity below the injection site that last more than 8 hours.  Hives or difficulty breathing ( go to the emergency room)  Inflammation or drainage at the injection site  Any new symptoms which are concerning to you  Please note:  Although the local anesthetic injected can often make your back/ hip/ buttock/ leg feel good for several hours after the injections, the pain will likely return.  It takes 3-7 days for steroids to work in the sacroiliac area.  You may not notice any pain relief for at least that one week.  If effective, we will often do a series of three injections spaced 3-6 weeks apart to maximally decrease your pain.  After the initial series, we generally will wait some months before a repeat injection of the same type.  If you have any questions, please call [phone]  [facility]   

## 2016-12-24 NOTE — Progress Notes (Signed)
Subjective:    Patient ID: Kimberly Buchanan, female    DOB: 03-09-1976, 41 y.o.   MRN: [id number]  HPI  Pt presents to the clinic today with c/o pain in her right buttock. She reports this has been an intermittent issue for her over the last 6 months. She describes the pain as dull and achy, but can be sharp and stabbing. The pain does not radiate down her leg, but she reports she has some mild numbness in toes. The pain seems worse with sitting or laying down. She denies any injury to the back. She denies loss of bowel or bladder. She has tried Meloxicam, heat and stretching with some relief.  Review of Systems      Past Medical History:  Diagnosis Date  . Broken jaw (HCC)   . Hand swelling     Current Outpatient Prescriptions  Medication Sig Dispense Refill  . chlorthalidone (HYGROTON) 25 MG tablet Take 1 tablet (25 mg total) by mouth daily. 30 tablet 3  . meloxicam (MOBIC) 15 MG tablet Take 1 tablet (15 mg total) by mouth daily. 30 tablet 5  . SUBOXONE 8-2 MG FILM Place 1 strip under the tongue 2 (two) times daily. Use 1 strip twice daily (max dose of 2.5 strips)  0  . valACYclovir (VALTREX) 1000 MG tablet Take 1 tablet (1,000 mg total) by mouth daily as needed. 30 tablet 2  . predniSONE (DELTASONE) 10 MG tablet Take 3 tabs on days 1-3, take 2 tabs on days 4-6, take 1 tab on days 7-9 18 tablet 0   No current facility-administered medications for this visit.     Allergies  Allergen Reactions  . Bactrim [Sulfamethoxazole-Trimethoprim]   . Sulfa Antibiotics     Family History  Problem Relation Age of Onset  . Hypertension Mother   . Arthritis Maternal Grandmother   . Cancer Maternal Grandmother        lung  . Hypertension Maternal Grandfather     Social History   Social History  . Marital status: Married    Spouse name: N/A  . Number of children: N/A  . Years of education: N/A   Occupational History  . Not on file.   Social History Main Topics  . Smoking  status: Former Games developermoker  . Smokeless tobacco: Never Used  . Alcohol use No  . Drug use: No  . Sexual activity: No   Other Topics Concern  . Not on file   Social History Narrative  . No narrative on file     Constitutional: Denies fever, malaise, fatigue, headache or abrupt weight changes.  Gastrointestinal: Denies abdominal pain, bloating, constipation, diarrhea or blood in the stool.  GU: Denies urgency, frequency, pain with urination, burning sensation, blood in urine, odor or discharge. Musculoskeletal: Pt reports pain in right buttock . Denies decrease in range of motion, difficulty with gait, muscle pain or joint swelling.  Neurological: Pt reports numbness in toes of right foot. Denies dizziness, difficulty with memory, difficulty with speech or problems with balance and coordination.    No other specific complaints in a complete review of systems (except as listed in HPI above).  Objective:   Physical Exam  BP 118/68   Pulse (!) 56   Temp 98.6 F (37 C) (Oral)   Wt 164 lb (74.4 kg)   SpO2 98%   BMI 23.53 kg/m  Wt Readings from Last 3 Encounters:  12/24/16 164 lb (74.4 kg)  10/05/16 160 lb 12.8 oz (72.9  kg)  08/15/16 159 lb (72.1 kg)    General: Appears her stated age, well developed, well nourished in NAD. Musculoskeletal: Normal flexion, extension and rotation of the spine. No bony tenderness noted over the spine. Pain with palpation of the right SI joint. Normal flexion, extension, abduction, adduction, internal and external rotation of the right hip. No difficulty with gait.  Neurological: Alert and oriented. Sensation intact to BLE.        Assessment & Plan:   SI Joint Pain:  Continue stretching, consider PT if no improvement Also consider xray right SI joint if pain persist Hold Meloxicam eRx for Pred Taper x 9 days Resume Meloxicam once done with Prednisone  Return precautions discussed Nicki ReaperBAITY, Dakarai Mcglocklin, NP

## 2017-01-01 ENCOUNTER — Telehealth: Payer: Self-pay

## 2017-01-01 NOTE — Telephone Encounter (Signed)
Any improvement with the prednisone taper?

## 2017-01-01 NOTE — Telephone Encounter (Signed)
Pt reports improvement in severity of pain, but pain is still present... Took last dose of prednisone yesterday and has not resumed Meloxicam

## 2017-01-01 NOTE — Telephone Encounter (Signed)
Pt was seen on 12/24/16 and has finished lower dose of prednisone. Pt continues with dull pain in rt buttock and hip area and wants to know if can get more of the prednisone to get rid of pain. [place] [facility]. Pt request cb when reviewed. Pamala Hurry Baity NP out of office.Please advise.

## 2017-01-01 NOTE — Telephone Encounter (Signed)
Pt is aware as instructed and expressed understanding 

## 2017-01-01 NOTE — Telephone Encounter (Signed)
Glad to hear she's feeling better. Will have her restart Meloxicam now, recommend she give it another 4-5 days with her Meloxicam. If no improvement by Monday next week then would recommend physical therapy. Please have her update us if no improvement.

## 2017-01-01 NOTE — Telephone Encounter (Signed)
I understand that, but the question is: did she experience any improvement with the prednisone? If not then another course would not be advisable. Also, note was reviewed when call was first read. The answer to my question will help guide my decision for her care. Thank you!

## 2017-01-01 NOTE — Telephone Encounter (Signed)
Pt states she is still having dull achy pain... Please advise  Per last OV not from Burr RidgeBaity below....  SI Joint Pain:  Continue stretching, consider PT if no improvement Also consider xray right SI joint if pain persist Hold Meloxicam eRx for Pred Taper x 9 days Resume Meloxicam once done with Prednisone

## 2017-01-03 ENCOUNTER — Ambulatory Visit (INDEPENDENT_AMBULATORY_CARE_PROVIDER_SITE_OTHER): Payer: BLUE CROSS/BLUE SHIELD | Admitting: Sports Medicine

## 2017-01-03 ENCOUNTER — Encounter: Payer: Self-pay | Admitting: Sports Medicine

## 2017-01-03 ENCOUNTER — Other Ambulatory Visit: Payer: Self-pay

## 2017-01-03 VITALS — BP 110/80 | HR 56 | Ht 70.0 in | Wt 164.2 lb

## 2017-01-03 DIAGNOSIS — G5701 Lesion of sciatic nerve, right lower limb: Secondary | ICD-10-CM | POA: Diagnosis not present

## 2017-01-03 DIAGNOSIS — M9903 Segmental and somatic dysfunction of lumbar region: Secondary | ICD-10-CM | POA: Diagnosis not present

## 2017-01-03 DIAGNOSIS — F1111 Opioid abuse, in remission: Secondary | ICD-10-CM

## 2017-01-03 DIAGNOSIS — M9905 Segmental and somatic dysfunction of pelvic region: Secondary | ICD-10-CM | POA: Diagnosis not present

## 2017-01-03 DIAGNOSIS — M461 Sacroiliitis, not elsewhere classified: Secondary | ICD-10-CM | POA: Diagnosis not present

## 2017-01-03 DIAGNOSIS — M5136 Other intervertebral disc degeneration, lumbar region: Secondary | ICD-10-CM | POA: Diagnosis not present

## 2017-01-03 DIAGNOSIS — Z87898 Personal history of other specified conditions: Secondary | ICD-10-CM | POA: Diagnosis not present

## 2017-01-03 DIAGNOSIS — M9904 Segmental and somatic dysfunction of sacral region: Secondary | ICD-10-CM

## 2017-01-03 MED ORDER — DOXEPIN HCL 25 MG PO CAPS: 25.0000 mg | ORAL_CAPSULE | Freq: Every day | ORAL | 1 refills | Status: DC

## 2017-01-03 MED ORDER — DOXEPIN HCL 25 MG PO CAPS
25.0000 mg | ORAL_CAPSULE | Freq: Every day | ORAL | 1 refills | Status: DC
Start: 2017-01-03 — End: 2017-04-08

## 2017-01-03 NOTE — Patient Instructions (Signed)
Also check out the YouTube Video from Dr. Myles LippsEric Goodman.  I would like to see you try performing this 5-6 days per week.    A good intro video is: "Independence from Pain 7-minute Video" - https://[email or web address]://www.[email or web address]/watch?[email or web address]   His more advanced video is: "Powerful Posture and Pain Relief: 12 minutes of Foundation Training" - https://youtu.be/4BOTvaRaDjI   Do not try to attempt this entire video when first beginning.    Try breaking of each exercise that he goes into shorter segments.  Otherwise if they perform an exercise for 45 seconds, start with 15 seconds and rest and then resume with a begin the new activity.  Work your way up to doing this 12 minute video and I expect to see significant improvements in your pain.    I recommend that you obtain over-the-counter SOLE  medium cushioned insoles.  These can be found at [employer] - or on-line at [employer]  Search for "SOLE Active Medium Shoe Insoles"   Try running no more than 2-3 miles per day but I'm okay bumping up to 4-5 days per week

## 2017-01-03 NOTE — Telephone Encounter (Signed)
noted 

## 2017-01-29 NOTE — Assessment & Plan Note (Signed)
Chronic ongoing issues with SI joint and piriformis inflammation on the right.  She like try to avoid pharmacotherapy for this and therapeutic exercises with   "Foundation Training" exercises discussed.  Osteopathic manipulation discussed as well and treatment provided today.  She does have fairly significant asymmetry in her right pelvic region and will benefit from core stabilization.  We will plan to follow-up with her in 4 weeks for repeat evaluation and consideration of repeat manipulation at that time.

## 2017-01-29 NOTE — Progress Notes (Signed)
OFFICE VISIT NOTE Kimberly FellsMichael D. Kimberly Shinerigby, DO  Kimberly Buch[facility] [phone]  Kimberly Ledererlizabeth L Buchanan - 41 y.o. female MRN [id number]  Date of birth: 18-Jan-1976  Visit Date: 01/03/2017  PCP: Kimberly Buchanan, Kimberly W, NP   Referred by: Kimberly Buchanan, Kimberly W, NP  SUBJECTIVE:   Chief Complaint  Patient presents with  . Initial Assessment    SI Joint Inflammation    HPI: As below and per problem based documentation when appropriate.  Patient is an avid runner presenting with worsening right low back pain and SI joint dysfunction that is worsened with activity.  Pain occasionally radiates down to the lateral thigh but never past her knee.  She has been working on some therapeutic exercises without significant improvement.  She has been has been trying pharmacotherapy was been on meloxicam with only minimal improvement.  She denies any changes in bowel or bladder.  No significant midline or upper back pain.  Pain is only worse with activity and running.  Pain is relieved with rest   But incompletely.    Review of Systems  Constitutional: Negative for chills, fever and weight loss.  Cardiovascular: Negative for leg swelling.  Skin: Negative for rash.  Neurological: Negative for sensory change and focal weakness.    Otherwise per HPI.  HISTORY & PERTINENT PRIOR DATA:  No specialty comments available. She reports that she has quit smoking. She has never used smokeless tobacco. No results for input(s): HGBA1C, LABURIC in the last 8760 hours. Medications & Allergies reviewed per EMR Patient Active Problem List   Diagnosis Date Noted  . Degeneration of lumbar intervertebral disc 01/03/2017  . Piriformis syndrome of right side 01/03/2017  . Closed fracture of angle of mandible with delayed healing 04/05/2016  . Depression 02/16/2016  . History of heroin abuse 02/16/2016   Past Medical History:  Diagnosis Date  . Broken jaw (HCC)   . Hand swelling    Family History   Problem Relation Age of Onset  . Hypertension Mother   . Arthritis Maternal Grandmother   . Cancer Maternal Grandmother        lung  . Hypertension Maternal Grandfather    No past surgical history on file. Social History   Occupational History  . Not on file.   Social History Main Topics  . Smoking status: Former Games developermoker  . Smokeless tobacco: Never Used  . Alcohol use No  . Drug use: No  . Sexual activity: No    OBJECTIVE:  VS:  HT:5\' 10"  (177.8 cm)   WT:164 lb 3.2 oz (74.5 kg)  BMI:23.6    BP:110/80  HR:(!) 56bpm  TEMP: ( )  RESP:99 % EXAM: Findings:  WDWN, NAD, Non-toxic appearing Alert & appropriately interactive Not depressed or anxious appearing No increased work of breathing. Pupils are equal. EOM intact without nystagmus No clubbing or cyanosis of the extremities appreciated No significant rashes/lesions/ulcerations overlying the examined area. DP & PT pulses 2+/4.  No significant pretibial edema. Sensation intact to light touch in lower extremities.    Right hemipelvis is anteriorly rotated.  She has pain with ASIS compression test.  Hip abduction strength on the right is 4 out of 5, left is 5 out of 5.  She has a markedly prolonged T FL recurrent pattern.  Good internal and external rotation bilateral hips.  She is able to heel toe walk without significant difficulty.  OSTEOPATHIC/STRUCTURAL EXAM:   Right anterior innominate L3 FRS right Left on left  sacral torsion   PROCEDURE NOTE : OSTEOPATHIC MANIPULATION The decision today to treat with Osteopathic Manipulative Therapy (OMT) was based on physical exam findings. Verbal consent was obtained after after explanation of risks, benefits and potential side effects, including acute pain flare, post manipulation soreness and need for repeat treatments.  If Cervical manipulation was performed additional time was spent discussing the associated minimal risk of  injury to neurovascular structures.  After consent  was obtained manipulation was performed as below:            Regions treated:  Per billing codes          Techniques used:  Muscle Energy, MFR, HVLA and ART The patient tolerated the treatment well and reported Improved symptoms following treatment today. Patient was given medications, exercises, stretches and lifestyle modifications per AVS and verbally.        No results found. ASSESSMENT & PLAN:     ICD-10-CM   1. SI (sacroiliac) joint inflammation (HCC) M46.1   2. Piriformis syndrome of right side G57.01   3. Degeneration of lumbar intervertebral disc M51.36   4. History of heroin abuse Z87.898   5. Segmental and somatic dysfunction of lumbar region M99.03   6. Segmental and somatic dysfunction of sacral region M99.04   7. Segmental and somatic dysfunction of pelvic region M99.05   ================================================================= Piriformis syndrome of right side Chronic ongoing issues with SI joint and piriformis inflammation on the right.  She like try to avoid pharmacotherapy for this and therapeutic exercises with   "Foundation Training" exercises discussed.  Osteopathic manipulation discussed as well and treatment provided today.  She does have fairly significant asymmetry in her right pelvic region and will benefit from core stabilization.  We will plan to follow-up with her in 4 weeks for repeat evaluation and consideration of repeat manipulation at that time. =================================================================  Follow-up: Return in about 4 weeks (around 01/31/2017).   CMA/ATC served as Neurosurgeon during this visit. History, Physical, and Plan performed by medical provider. Documentation and orders reviewed and attested to.      Gaspar Bidding, DO    [facility]

## 2017-02-01 ENCOUNTER — Ambulatory Visit (INDEPENDENT_AMBULATORY_CARE_PROVIDER_SITE_OTHER): Payer: BLUE CROSS/BLUE SHIELD

## 2017-02-01 ENCOUNTER — Ambulatory Visit (INDEPENDENT_AMBULATORY_CARE_PROVIDER_SITE_OTHER): Payer: BLUE CROSS/BLUE SHIELD | Admitting: Sports Medicine

## 2017-02-01 ENCOUNTER — Encounter: Payer: Self-pay | Admitting: Sports Medicine

## 2017-02-01 VITALS — BP 100/76 | HR 55 | Ht 70.0 in | Wt 163.4 lb

## 2017-02-01 DIAGNOSIS — M5441 Lumbago with sciatica, right side: Secondary | ICD-10-CM

## 2017-02-01 NOTE — Patient Instructions (Signed)
We are referring you to Dr. Alvester MorinNewton for an epidural steroid injection in your back.  If you would like to try to change medications from the doxepin to gabapentin please call and let us know.

## 2017-02-01 NOTE — Progress Notes (Signed)
OFFICE VISIT NOTE Kimberly FellsMichael D. Kimberly Shinerigby, DO  [facility] [phone]  Kimberly Ledererlizabeth L Buchanan - 41 y.o. female MRN [id number]  Date of birth: March 18, 1976  Visit Date: 02/01/2017  PCP: Kimberly Buchanan, Kimberly W, NP   Referred by: Kimberly Buchanan, Kimberly W, NP  Kimberly Buchanan, CMA acting as scribe for Dr. Berline Choughigby.  SUBJECTIVE:   Chief Complaint  Patient presents with  . Follow-up    SI joint pain   HPI: As below and per problem based documentation when appropriate.  Kimberly Manislizabeth is an established patient presenting today in follow-up of SI joint pain. She was last seen 01/03/17 and had OMT. She was provided with link to [employer] exercises.   Pt reports good response to OMT at her last visit. She has noticed worsening sx over the past 2 weeks. She stopped running about 1 week ago. She went for massage this past Wednesday and had the therapist work on her glutes. The pain seems to have flared up after the massage and how she has radiating pain into the RT leg. 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 She feels like everything causes her pain to flare up, sleeping, walking, running, standing in once for a period of time. She notices improvement pain when bending her RT knee to her chest or squatting. She sits on a heating pad sometimes and that helps with the pain. She is not taking any medications for the pain. She was taking Doxepin but it gives her dry mouth and causes her to be fatigued so she isn't taking it all the time. Pain is rated about 7/10 at its worst. Pain is described as either a dull ache and a radiating/shooting pain. Pain seems to be worst in the mornings when first getting up.   No recent xray of the lower back, she had MRI done around 12/2009    Review of Systems  Constitutional: Negative for chills and fever.  Respiratory: Negative for shortness of breath and wheezing.   Cardiovascular: Negative for chest pain, palpitations and  leg swelling.  Gastrointestinal: Negative.   Genitourinary: Negative.   Musculoskeletal: Positive for joint pain. Negative for back pain and falls.  Neurological: Positive for tingling (lower leg and foot). Negative for dizziness and headaches.  Endo/Heme/Allergies: Does not bruise/bleed easily.    Otherwise per HPI.  HISTORY & PERTINENT PRIOR DATA:  No specialty comments available. She reports that she has quit smoking. She has never used smokeless tobacco. No results for input(s): HGBA1C, LABURIC in the last 8760 hours. Medications & Allergies reviewed per EMR Patient Active Problem List   Diagnosis Date Noted  . Right-sided low back pain with right-sided sciatica 02/19/2017  . Degeneration of lumbar intervertebral disc 01/03/2017  . Piriformis syndrome of right side 01/03/2017  . Closed fracture of angle of mandible with delayed healing 04/05/2016  . Depression 02/16/2016  . History of heroin abuse 02/16/2016   Past Medical History:  Diagnosis Date  . Broken jaw (HCC)   . Hand swelling    Family History  Problem Relation Age of Onset  . Hypertension Mother   . Arthritis Maternal Grandmother   . Cancer Maternal Grandmother        lung  . Hypertension Maternal Grandfather    No past surgical history on file. Social History   Occupational History  . Not on file.   Social History Main Topics  . Smoking status: Former Games developermoker  . Smokeless tobacco: Never Used  .  Alcohol use No  . Drug use: No  . Sexual activity: No    OBJECTIVE:  VS:  HT:5\' 10"  (177.8 cm)   WT:163 lb 6.4 oz (74.1 kg)  BMI:23.45    BP:100/76  HR:(!) 55bpm  TEMP: ( )  RESP:98 % EXAM: Findings:  Adult female.  No acute distress.  Alert and appropriate. Low back has significant muscle spasms on the right compared to the left.  She has no significant pain with straight leg raise or with internal or external rotation of the hips.  Lower extremity sensation is intact light touch.  She has overall  well-maintained strength of the bilateral lower extremities but does have right greater than left hip flexor predominant physician at rest.  She is difficult time going into full extension.      Dg Lumbar Spine 2-3 Views  Result Date: 02/01/2017 CLINICAL DATA:  41 year old female with a history of worsening right lower back pain EXAM: LUMBAR SPINE - 2-3 VIEW COMPARISON:  MRI 01/04/2010 FINDINGS: Lumbar Spine: Lumbar vertebral elements maintain normal alignment without evidence of subluxation. Trace retrolisthesis of L3 on L4. Minimal anterolisthesis of L4 on L5. These changes are new from the comparison MRI of 2011. No fracture line identified.  Vertebral body heights maintained. Height loss of the disc space of L5-S1 with associated facet hypertrophy. Endplate sclerosis at L5-S1. Unremarkable appearance of the visualized abdomen. IMPRESSION: No acute fracture or malalignment of the lumbar spine. Degenerative disc disease with disc space narrowing and endplate sclerosis of L5-S1 with associated facet hypertrophy. There is new mild retrolisthesis of L3 on L4 and new mild anterolisthesis of L4 on L5. Electronically Signed   By: Gilmer Mor D.O.   On: 02/01/2017 13:52    ASSESSMENT & PLAN:     ICD-10-CM   1. Right-sided low back pain with right-sided sciatica, unspecified chronicity M54.41 DG Lumbar Spine 2-3 Views    Ambulatory referral to Physical Medicine Rehab   ================================================================= Right-sided low back pain with right-sided sciatica Slight worsening in her symptoms I suspect a component of lumbar facet mediated pain versus right-sided radiculitis although not entirely consistent.  We will plan to have her follow-up with Dr. Alvester Morin for empiric L5/S1 ESI on the right and will plan to follow-up with her in 6 weeks to ensure clinical improvement.  If any lack of improvement can consider further diagnostic evaluation at that time with MRI of the lumbar spine  would like to defer this at this time.  Continue with foundations training and consider referral to physical therapy but she would like to defer this at this time.  >50% of this 25 minute visit spent in direct patient counseling and/or coordination of care.  Discussion was focused on education regarding the in discussing the pathoetiology and anticipated clinical course of the above condition.  Long discussion regarding options given her prior history of opioid abuse she would not like to consider further pharmacologic therapy.   ================================================================= Patient Instructions  We are referring you to Dr. Alvester Morin for an epidural steroid injection in your back.  If you would like to try to change medications from the doxepin to gabapentin please call and let us know.  =================================================================   Follow-up: Return in about 6 weeks (around 03/15/2017).   CMA/ATC served as Neurosurgeon during this visit. History, Physical, and Plan performed by medical provider. Documentation and orders reviewed and attested to.      Gaspar Bidding, DO    [facility]

## 2017-02-06 ENCOUNTER — Telehealth: Payer: Self-pay | Admitting: Sports Medicine

## 2017-02-06 MED ORDER — GABAPENTIN 100 MG PO CAPS: ORAL_CAPSULE | ORAL | 1 refills | Status: DC

## 2017-02-06 NOTE — Telephone Encounter (Signed)
Patient would like a new script for gabapentin. Her pain is very intense.   [facility]  [address]  [place]

## 2017-02-06 NOTE — Telephone Encounter (Signed)
Forwarding to Dr. Berline Choughigby to rx dose/directions.

## 2017-02-06 NOTE — Telephone Encounter (Signed)
Called pt and advised per Dr. Berline Choughigby. Pt verbalized understanding.

## 2017-02-06 NOTE — Telephone Encounter (Signed)
She has been sensitive to medications before so we will start her on 100 mg titrating up to 3 times per day.  If any lack of improvement I am okay with her increasing up to 200 mg 3 times per day and a very short period but would prefer her to start slow as outlined per prescription that has been sent in.

## 2017-02-18 ENCOUNTER — Encounter (INDEPENDENT_AMBULATORY_CARE_PROVIDER_SITE_OTHER): Payer: Self-pay | Admitting: Physical Medicine and Rehabilitation

## 2017-02-18 ENCOUNTER — Ambulatory Visit (INDEPENDENT_AMBULATORY_CARE_PROVIDER_SITE_OTHER): Payer: Self-pay

## 2017-02-18 ENCOUNTER — Ambulatory Visit (INDEPENDENT_AMBULATORY_CARE_PROVIDER_SITE_OTHER): Payer: BLUE CROSS/BLUE SHIELD | Admitting: Physical Medicine and Rehabilitation

## 2017-02-18 VITALS — BP 116/77 | HR 50

## 2017-02-18 DIAGNOSIS — M5416 Radiculopathy, lumbar region: Secondary | ICD-10-CM | POA: Diagnosis not present

## 2017-02-18 MED ORDER — BETAMETHASONE SOD PHOS & ACET 6 (3-3) MG/ML IJ SUSP
12.0000 mg | Freq: Once | INTRAMUSCULAR | Status: AC
  Administered 2017-02-18: 12 mg

## 2017-02-18 MED ORDER — LIDOCAINE HCL (PF) 1 % IJ SOLN
2.0000 mL | Freq: Once | INTRAMUSCULAR | Status: AC
  Administered 2017-02-18: 2 mL

## 2017-02-18 NOTE — Progress Notes (Deleted)
Right side low back and into hip for 3 months. Radiates down side of leg to foot. Can't lay on right side. Numbness in leg and top of foot.

## 2017-02-18 NOTE — Patient Instructions (Signed)

## 2017-02-19 DIAGNOSIS — M5441 Lumbago with sciatica, right side: Secondary | ICD-10-CM | POA: Insufficient documentation

## 2017-02-19 NOTE — Assessment & Plan Note (Signed)
Slight worsening in her symptoms I suspect a component of lumbar facet mediated pain versus right-sided radiculitis although not entirely consistent.  We will plan to have her follow-up with Dr. Alvester Morin for empiric L5/S1 ESI on the right and will plan to follow-up with her in 6 weeks to ensure clinical improvement.  If any lack of improvement can consider further diagnostic evaluation at that time with MRI of the lumbar spine would like to defer this at this time.  Continue with foundations training and consider referral to physical therapy but she would like to defer this at this time.

## 2017-02-20 NOTE — Procedures (Signed)
Kimberly Buchanan is a 41 year old female who comes in today at the request of Dr. Berline Chough for epidural injection. She is an avid runner and has been having chronic worsening 3 months of right sided low back and pain in the hip laterally that radiates down the side of the leg to the foot and a pretty classic L5 distribution. She has failed conservative care to his notes can be reviewed. She reports a lot of alternative lie on the right side. She does get paresthesia type sensations. Prior MRI from 2011 actually did show lateral disc herniation at L5-S1 which would correspond to the patient's current pain pattern. Depending on relief with diagnostic L5 transforaminal injection if the patient was not getting much better she probably would warrant new MRI. She has no focal weakness on exam.  Lumbosacral Transforaminal Epidural Steroid Injection - Sub-Pedicular Approach with Fluoroscopic Guidance  Patient: Kimberly Buchanan      Date of Birth: September 17, 1975 MRN: [id number] PCP: Lorre Munroe, NP      Visit Date: 02/18/2017   Universal Protocol:    Date/Time: 02/18/2017  Consent Given By: the patient  Position: PRONE  Additional Comments: Vital signs were monitored before and after the procedure. Patient was prepped and draped in the usual sterile fashion. The correct patient, procedure, and site was verified.   Injection Procedure Details:  Procedure Site One Meds Administered:  Meds ordered this encounter  Medications  . lidocaine (PF) (XYLOCAINE) 1 % injection 2 mL  . betamethasone acetate-betamethasone sodium phosphate (CELESTONE) injection 12 mg    Laterality: Right  Location/Site:  L5-S1  Needle size: 22 G  Needle type: Spinal  Needle Placement: Transforaminal  Findings:  -Contrast Used: 1 mL iohexol 180 mg iodine/mL   -Comments: Excellent flow of contrast along the nerve and into the epidural space.  Procedure Details: After squaring off the end-plates to get a true AP view,  the C-arm was positioned so that an oblique view of the foramen as noted above was visualized. The target area is just inferior to the "nose of the scotty dog" or sub pedicular. The soft tissues overlying this structure were infiltrated with 2-3 ml. of 1% Lidocaine without Epinephrine.  The spinal needle was inserted toward the target using a "trajectory" view along the fluoroscope beam.  Under AP and lateral visualization, the needle was advanced so it did not puncture dura and was located close the 6 O'Clock position of the pedical in AP tracterory. Biplanar projections were used to confirm position. Aspiration was confirmed to be negative for CSF and/or blood. A 1-2 ml. volume of Isovue-250 was injected and flow of contrast was noted at each level. Radiographs were obtained for documentation purposes.   After attaining the desired flow of contrast documented above, a 0.5 to 1.0 ml test dose of 0.25% Marcaine was injected into each respective transforaminal space.  The patient was observed for 90 seconds post injection.  After no sensory deficits were reported, and normal lower extremity motor function was noted,   the above injectate was administered so that equal amounts of the injectate were placed at each foramen (level) into the transforaminal epidural space.   Additional Comments:  The patient tolerated the procedure well Dressing: Band-Aid    Post-procedure details: Patient was observed during the procedure. Post-procedure instructions were reviewed.  Patient left the clinic in stable condition.

## 2017-02-27 ENCOUNTER — Ambulatory Visit (INDEPENDENT_AMBULATORY_CARE_PROVIDER_SITE_OTHER): Payer: BLUE CROSS/BLUE SHIELD | Admitting: Sports Medicine

## 2017-02-27 ENCOUNTER — Encounter: Payer: Self-pay | Admitting: Sports Medicine

## 2017-02-27 VITALS — BP 104/74 | HR 49 | Ht 70.0 in | Wt 166.2 lb

## 2017-02-27 DIAGNOSIS — M5441 Lumbago with sciatica, right side: Secondary | ICD-10-CM | POA: Diagnosis not present

## 2017-02-27 DIAGNOSIS — Z87898 Personal history of other specified conditions: Secondary | ICD-10-CM

## 2017-02-27 DIAGNOSIS — M5136 Other intervertebral disc degeneration, lumbar region: Secondary | ICD-10-CM

## 2017-02-27 DIAGNOSIS — F1111 Opioid abuse, in remission: Secondary | ICD-10-CM

## 2017-02-27 MED ORDER — IBUPROFEN-FAMOTIDINE 800-26.6 MG PO TABS: 1.0000 | ORAL_TABLET | Freq: Three times a day (TID) | ORAL | 0 refills | Status: DC | PRN

## 2017-02-27 NOTE — Progress Notes (Signed)
OFFICE VISIT NOTE Kimberly Buchanan. Kimberly Buchanan Sports [facility] at [facility] [phone]  Kimberly Buchanan - 41 y.o. female MRN [id number]  Date of birth: 1975-10-04  Visit Date: 02/27/2017  PCP: Lorre Munroe, NP   Referred by: Lorre Munroe, NP  [name] PT, LAT, ATC acting as scribe for Dr. Berline Chough.  SUBJECTIVE:   Chief Complaint  Patient presents with  . Follow-up    LBP and SIJ pain   HPI: As below and per problem based documentation when appropriate.  Kimberly Buchanan is an established pt presenting today for follow-up of her LBP and SIJ pain.  She recently had an epidural injection on 02/18/17.   Pt notes no improvement in her symptoms since the injection.  She states that the gabapentin is also not helping, just making her gain weight.  Pt reports almost constant pain in her R lower back and SIJ.  She states that she is currently experiencing 4/10 pain while sitting but it will increase to a 9-10/10 at its worst.  Pt states that she was a runner previously but that she has not been able to do anything physical activity wise x 3 months.  Pt notes that she has paresthesias into her R LE, below the knee and along the anterior lower leg into the ankle.  She notes that she has intermittently tried the Brenton exercises but has a hard time w/ standing which makes them challenging.    Review of Systems  Constitutional: Negative for chills and fever.  HENT: Negative.   Eyes: Negative.   Respiratory: Negative for cough, shortness of breath and wheezing.   Cardiovascular: Negative for chest pain and palpitations.  Gastrointestinal: Positive for heartburn. Negative for abdominal pain and nausea.  Musculoskeletal: Positive for back pain and joint pain. Negative for falls.  Neurological: Negative for dizziness, tingling and headaches.  Endo/Heme/Allergies: Does not bruise/bleed easily.  Psychiatric/Behavioral: Positive for depression. The patient is  nervous/anxious and has insomnia.     Otherwise per HPI.  HISTORY & PERTINENT PRIOR DATA:  No specialty comments available. She reports that she has quit smoking. She has never used smokeless tobacco. No results for input(s): HGBA1C, LABURIC in the last 8760 hours. Allergies reviewed per EMR Prior to Admission medications   Medication Sig Start Date End Date Taking? Authorizing Provider  chlorthalidone (HYGROTON) 25 MG tablet Take 1 tablet (25 mg total) by mouth daily. 08/15/16  Yes Dianne Dun, MD  doxepin (SINEQUAN) 25 MG capsule Take 1 capsule (25 mg total) by mouth at bedtime. 01/03/17  Yes Andrena Mews, DO  gabapentin (NEURONTIN) 100 MG capsule Start with 1 tab po qhs X 3 days, then increase to 1 tab po bid X 3 days then 1 tab po tid prn 02/06/17  Yes Andrena Mews, DO  Ibuprofen-Famotidine (DUEXIS) 800-26.6 MG TABS Take 1 tablet by mouth 3 (three) times daily as needed. 02/27/17   Andrena Mews, DO   Patient Active Problem List   Diagnosis Date Noted  . Right-sided low back pain with right-sided sciatica 02/19/2017  . Degeneration of lumbar intervertebral disc 01/03/2017  . Piriformis syndrome of right side 01/03/2017  . Closed fracture of angle of mandible with delayed healing 04/05/2016  . Depression 02/16/2016  . History of heroin abuse 02/16/2016   Past Medical History:  Diagnosis Date  . Broken jaw (HCC)   . Hand swelling    Family History  Problem Relation Age  of Onset  . Hypertension Mother   . Arthritis Maternal Grandmother   . Cancer Maternal Grandmother        lung  . Hypertension Maternal Grandfather    No past surgical history on file. Social History   Occupational History  . Not on file.   Social History Main Topics  . Smoking status: Former Games developer  . Smokeless tobacco: Never Used  . Alcohol use No  . Drug use: No  . Sexual activity: No    OBJECTIVE:  VS:  HT:5\' 10"  (177.8 cm)   WT:166 lb 3.2 oz (75.4 kg)  BMI:23.85    BP:104/74  HR:(!)  49bpm  TEMP: ( )  RESP:98 % EXAM: Findings:  Back is overall well aligned.  She is quite muscular.  She has marked pain with popliteal compression as well as greater sciatic notch palpation that causes and reproduces the radicular symptoms into her right lower lateral leg that she is having.  She has minimal pain with straight leg raise.  Pain is alleviated with lumbar extension but only slightly.  She has no focal bony tenderness.  Forward flexion does reproduce a minimal amount of her pain.  She is able to heel and toe walk without difficulty.  Her extensor hallucis longus and posterior tibialis muscle testing is 5 out of 5 strength with manual muscle testing.  She does have a generalized dysesthesia in the L5 distribution on the right.    RADIOLOGY: Epidural Steroid injection [name], MD     02/20/2017  5:23 AM [name] is a 41 year old female who comes in today at the  request of Dr. Berline Chough for epidural injection. She is an avid  runner and has been having chronic worsening 3 months of right  sided low back and pain in the hip laterally that radiates down  the side of the leg to the foot and a pretty classic L5  distribution. She has failed conservative care to his notes can  be reviewed. She reports a lot of alternative lie on the right  side. She does get paresthesia type sensations. Prior MRI from  2011 actually did show lateral disc herniation at L5-S1 which  would correspond to the patient's current pain pattern. Depending  on relief with diagnostic L5 transforaminal injection if the  patient was not getting much better she probably would warrant  new MRI. She has no focal weakness on exam.  Lumbosacral Transforaminal Epidural Steroid Injection -  Sub-Pedicular Approach with Fluoroscopic Guidance  Patient: Kimberly Buchanan      Date of Birth: 10/15/1975 MRN: [id number] PCP: Lorre Munroe, NP      Visit Date: 02/18/2017   Universal Protocol:    Date/Time:  02/18/2017  Consent Given By: the patient  Position: PRONE  Additional Comments: Vital signs were monitored before and after the procedure. Patient was prepped and draped in the usual sterile fashion. The correct patient, procedure, and site was verified.  Injection Procedure Details:  Procedure Site One Meds Administered:  Meds ordered this encounter  Medications  . lidocaine (PF) (XYLOCAINE) 1 % injection 2 mL  . betamethasone acetate-betamethasone sodium phosphate  (CELESTONE) injection 12 mg   Laterality: Right  Location/Site:  L5-S1  Needle size: 22 G  Needle type: Spinal  Needle Placement: Transforaminal  Findings:  -Contrast Used: 1 mL iohexol 180 mg iodine/mL   -Comments: Excellent flow of contrast along the nerve and into  the epidural space.  Procedure Details: After squaring off the end-plates  to get a true AP view, the  C-arm was positioned so that an oblique view of the foramen as  noted above was visualized. The target area is just inferior to  the "nose of the scotty dog" or sub pedicular. The soft tissues  overlying this structure were infiltrated with 2-3 ml. of 1%  Lidocaine without Epinephrine.  The spinal needle was inserted toward the target using a  "trajectory" view along the fluoroscope beam.  Under AP and  lateral visualization, the needle was advanced so it did not  puncture dura and was located close the 6 O'Clock position of the  pedical in AP tracterory. Biplanar projections were used to  confirm position. Aspiration was confirmed to be negative for CSF  and/or blood. A 1-2 ml. volume of Isovue-250 was injected and  flow of contrast was noted at each level. Radiographs were  obtained for documentation purposes.   After attaining the desired flow of contrast documented above, a  0.5 to 1.0 ml test dose of 0.25% Marcaine was injected into each  respective transforaminal space.  The patient was observed for 90  seconds post injection.   After no sensory deficits were reported,  and normal lower extremity motor function was noted,   the above  injectate was administered so that equal amounts of the injectate  were placed at each foramen (level) into the transforaminal  epidural space.  Additional Comments:  The patient tolerated the procedure well Dressing: Band-Aid    Post-procedure details: Patient was observed during the procedure. Post-procedure instructions were reviewed.  Patient left the clinic in stable condition.  ASSESSMENT & PLAN:     ICD-10-CM   1. Right-sided low back pain with right-sided sciatica, unspecified chronicity M54.41 MR Lumbar Spine Wo Contrast    Ibuprofen-Famotidine (DUEXIS) 800-26.6 MG TABS  2. Degeneration of lumbar intervertebral disc M51.36   3. History of heroin abuse Z87.898    ================================================================= Right-sided low back pain with right-sided sciatica Persistent ongoing pain in spite of conservative measures including diagnostic and therapeutic injection.  She reported good improvement the following day after injection but symptoms quickly returned she reports actually feeling worse today than she has throughout the entire treatment course.  Pain continues to radiate down the lateral aspect of the leg is consistent with an L5 radiculitis.  She did have a degenerative change at this level from an MRI of 2011 and I suspect that this is significantly worsened.  She may need surgical consultation depending on the results of her MRI but will obtain this and plan to follow-up with her after this is completed.  We will get her a sample of Duexis to see if this is beneficial for pain control for she has reported some gastrointestinal issues with the meloxicam she has been taking.  We will plan to have her follow-up after the MRI is obtained to discuss further treatment options.  History of heroin abuse Not interested in other pharmacologic therapy  including tramadol at this time  No notes on file ================================================================= Patient Instructions  We are ordering an MRI for you today.  The imaging office will be calling you to schedule your appointment after we obtain authorization from your insurance company.   Please be sure you have signed up for MyChart so that we can get your results to you.  We will be in touch with you as soon as we can.  Please know, it can take up to 3-4 business days for the radiologist and Dr.  Berline Chough to have time to review the results and determine the best appropriate action.  If there is something that appears to be surgical or needs a referral to other specialists we will let you know through MyChart or telephone.  Otherwise we will plan to schedule a follow up appointment with Dr. Berline Chough once we have the results.    ================================================================= No future appointments.  Follow-up: Return for MRI review.   CMA/ATC served as Neurosurgeon during this visit. History, Physical, and Plan performed by medical provider. Documentation and orders reviewed and attested to.      Gaspar Bidding, DO    [facility]

## 2017-02-27 NOTE — Patient Instructions (Signed)

## 2017-02-27 NOTE — Assessment & Plan Note (Signed)
Persistent ongoing pain in spite of conservative measures including diagnostic and therapeutic injection.  She reported good improvement the following day after injection but symptoms quickly returned she reports actually feeling worse today than she has throughout the entire treatment course.  Pain continues to radiate down the lateral aspect of the leg is consistent with an L5 radiculitis.  She did have a degenerative change at this level from an MRI of 2011 and I suspect that this is significantly worsened.  She may need surgical consultation depending on the results of her MRI but will obtain this and plan to follow-up with her after this is completed.  We will get her a sample of Duexis to see if this is beneficial for pain control for she has reported some gastrointestinal issues with the meloxicam she has been taking.  We will plan to have her follow-up after the MRI is obtained to discuss further treatment options.

## 2017-02-27 NOTE — Assessment & Plan Note (Signed)
Not interested in other pharmacologic therapy including tramadol at this time

## 2017-03-10 ENCOUNTER — Ambulatory Visit
Admission: RE | Admit: 2017-03-10 | Discharge: 2017-03-10 | Disposition: A | Discharge status: Home / Self Care | Payer: BLUE CROSS/BLUE SHIELD | Source: Ambulatory Visit | Attending: Sports Medicine | Admitting: Sports Medicine

## 2017-03-10 ENCOUNTER — Other Ambulatory Visit: Payer: BLUE CROSS/BLUE SHIELD

## 2017-03-10 DIAGNOSIS — M5441 Lumbago with sciatica, right side: Secondary | ICD-10-CM

## 2017-03-13 ENCOUNTER — Encounter: Payer: Self-pay | Admitting: Sports Medicine

## 2017-03-13 ENCOUNTER — Ambulatory Visit (INDEPENDENT_AMBULATORY_CARE_PROVIDER_SITE_OTHER): Payer: BLUE CROSS/BLUE SHIELD | Admitting: Sports Medicine

## 2017-03-13 VITALS — BP 102/68 | HR 69 | Ht 70.0 in | Wt 163.8 lb

## 2017-03-13 DIAGNOSIS — M7138 Other bursal cyst, other site: Secondary | ICD-10-CM | POA: Diagnosis not present

## 2017-03-13 NOTE — Progress Notes (Signed)
OFFICE VISIT NOTE Kimberly Buchanan. Kimberly Buchanan Sports Medicine [facility] at [facility] [phone]  Kimberly Buchanan - 41 y.o. female MRN [id number]  Date of birth: [birth year]  Visit Date: 03/13/2017  PCP: Lorre Munroe, NP   Referred by: Lorre Munroe, NP  Kimberly Buchanan PT, LAT, ATC acting as scribe for Dr. Berline Chough.  SUBJECTIVE:   Chief Complaint  Patient presents with  . Follow-up    LBP MRI report   HPI: As below and per problem based documentation when appropriate.  [name] is an established pt presenting today for f/u of her LBP and review of her MRI results.  Pt was last seen on 02/27/17 and had her MRI on 03/10/17.  Pt states that she's had relatively no change in her symptoms since her last visit and feels like her symptoms may be worse.  She states that she con't to have lower back pain w/ radicular symptoms into her R glute and  R lower leg.    Review of Systems  Gastrointestinal: Positive for heartburn.  Musculoskeletal: Positive for back pain and joint pain.  Psychiatric/Behavioral: Positive for depression. The patient is nervous/anxious and has insomnia.   All other systems reviewed and are negative.   Otherwise per HPI.  HISTORY & PERTINENT PRIOR DATA:  No specialty comments available. She reports that she has quit smoking. She has never used smokeless tobacco. No results for input(s): HGBA1C, LABURIC in the last 8760 hours. Allergies reviewed per EMR Prior to Admission medications   Medication Sig Start Date End Date Taking? Authorizing Provider  chlorthalidone (HYGROTON) 25 MG tablet Take 1 tablet (25 mg total) by mouth daily. 08/15/16   Dianne Dun, MD  doxepin (SINEQUAN) 25 MG capsule Take 1 capsule (25 mg total) by mouth at bedtime. 01/03/17   Andrena Mews, DO   Patient Active Problem List   Diagnosis Date Noted  . Synovial cyst of lumbar facet joint 03/13/2017  . Right-sided low back pain with right-sided sciatica 02/19/2017    . Degeneration of lumbar intervertebral disc 01/03/2017  . Piriformis syndrome of right side 01/03/2017  . Closed fracture of angle of mandible with delayed healing 04/05/2016  . Depression 02/16/2016  . History of heroin abuse 02/16/2016   Past Medical History:  Diagnosis Date  . Broken jaw (HCC)   . Hand swelling    Family History  Problem Relation Age of Onset  . Hypertension Mother   . Arthritis Maternal Grandmother   . Cancer Maternal Grandmother        lung  . Hypertension Maternal Grandfather    No past surgical history on file. Social History   Occupational History  . Not on file.   Social History Main Topics  . Smoking status: Former Games developer  . Smokeless tobacco: Never Used  . Alcohol use No  . Drug use: No  . Sexual activity: No    OBJECTIVE:  VS:  HT:5\' 10"  (177.8 cm)   WT:163 lb 12.8 oz (74.3 kg)  BMI:23.5    BP:102/68  HR:69bpm  TEMP: ( )  RESP:97 % EXAM: Findings:  Adult female.  Uncomfortable.  Pain with straight leg raise.  Overall good range of motion however.  Lumbar range of motion is normal but painful especially with any type of flexion or extension.  She is able to heel toe walk without significant difficulty.     RADIOLOGY: MR Lumbar Spine Wo Contrast CLINICAL DATA:  Lumbar radiculopathy.  Right-sided low back pain and sciatica.  EXAM: MRI LUMBAR SPINE WITHOUT CONTRAST  TECHNIQUE: Multiplanar, multisequence MR imaging of the lumbar spine was performed. No intravenous contrast was administered.  COMPARISON:  01/04/2010  FINDINGS: Segmentation:  Standard  Alignment:  Grade 1 anterolisthesis at L4-5, facet mediated.  Vertebrae: Degenerative marrow edema about both L4-5 facets. There is interspinous bursa at L4-5 attributed to increased motion. No acute fracture, discitis, or aggressive bone lesion.  Conus medullaris: Extends to the L1-2 level and appears normal.  Paraspinal and other soft tissues: Periarticular edema about  the L4-5 facets.  Disc levels:  T12- L1: Mild facet spurring.  No herniation or impingement  L1-L2: Mild disc narrowing and desiccation.  No impingement  L2-L3: Unremarkable.  L3-L4: Mild disc desiccation and narrowing with right more than left foraminal bulging. Early facet spurring. No impingement  L4-L5: Severe facet arthropathy with bilateral joint fluid and distortion. There are 2 right-sided synovial cysts projecting anteriorly and posteriorly. The anterior cyst measures 1.6 Cm and effaces the right subarticular recess. Right L5 nerve root appears relatively thickened. There is moderate spinal stenosis at this level. Bilateral noncompressive foraminal narrowing.  L5-S1:Greatest level degenerative disc narrowing with bulging and endplate ridging. Mild facet spurring.Moderate bilateral foraminal narrowing. Patent spinal canal  IMPRESSION: 1. L4-5 advanced facet arthropathy with anterolisthesis and marrow edema. 16 mm right synovial cyst effaces the subarticular recess and impinges on the thickened right L5 nerve root. Spinal stenosis is moderate. 2. L5-S1 advanced disc degeneration with moderate bilateral foraminal narrowing.  Electronically Signed   By: Marnee SpringJonathon  Watts M.D.   On: 03/10/2017 10:20  ASSESSMENT & PLAN:     ICD-10-CM   1. Synovial cyst of lumbar facet joint M71.38 Ambulatory referral to Neurosurgery   ================================================================= Synovial cyst of lumbar facet joint Given the severity of the pain lack of improvement with conservative measures and findings on MRI surgical intervention indicated at this time.  Referral placed to [place] [facility].  We will plan to have her continue with anti-inflammatories as needed and discussed using a back brace.  She will follow up with me as needed.  No notes on file ================================================================= There are no Patient Instructions on file for  this visit. ================================================================= No future appointments.  Follow-up: No Follow-up on file.   CMA/ATC served as Neurosurgeonscribe during this visit. History, Physical, and Plan performed by medical provider. Documentation and orders reviewed and attested to.      Gaspar BiddingMichael Leafy Motsinger, DO    [facility] Phys[facility]

## 2017-03-13 NOTE — Assessment & Plan Note (Signed)
Given the severity of the pain lack of improvement with conservative measures and findings on MRI surgical intervention indicated at this time.  Referral placed to [place] neurosurgery.  We will plan to have her continue with anti-inflammatories as needed and discussed using a back brace.  She will follow up with me as needed.

## 2017-03-15 ENCOUNTER — Ambulatory Visit: Payer: BLUE CROSS/BLUE SHIELD | Admitting: Sports Medicine

## 2017-03-20 ENCOUNTER — Other Ambulatory Visit: Payer: Self-pay | Admitting: Family Medicine

## 2017-03-21 NOTE — Telephone Encounter (Signed)
RB patient/thx dmf

## 2017-03-22 ENCOUNTER — Other Ambulatory Visit: Payer: Self-pay | Admitting: Neurosurgery

## 2017-04-15 ENCOUNTER — Encounter (HOSPITAL_COMMUNITY): Admission: RE | Payer: Self-pay | Source: Ambulatory Visit

## 2017-04-15 ENCOUNTER — Inpatient Hospital Stay (HOSPITAL_COMMUNITY): Admission: RE | Admit: 2017-04-15 | Payer: BLUE CROSS/BLUE SHIELD | Source: Ambulatory Visit | Admitting: Neurosurgery

## 2017-04-15 SURGERY — POSTERIOR LUMBAR FUSION 1 LEVEL
Anesthesia: General

## 2017-05-15 ENCOUNTER — Ambulatory Visit: Payer: BLUE CROSS/BLUE SHIELD | Admitting: Family Medicine

## 2017-05-15 ENCOUNTER — Encounter: Payer: Self-pay | Admitting: Family Medicine

## 2017-05-15 DIAGNOSIS — R300 Dysuria: Secondary | ICD-10-CM

## 2017-05-15 LAB — POCT URINALYSIS DIPSTICK
Bilirubin, UA: NEGATIVE
Glucose, UA: NEGATIVE
Ketones, UA: NEGATIVE
LEUKOCYTES UA: NEGATIVE
NITRITE UA: NEGATIVE
PH UA: 6 (ref 5.0–8.0)
PROTEIN UA: NEGATIVE
RBC UA: NEGATIVE
SPEC GRAV UA: 1.01 (ref 1.010–1.025)
Urobilinogen, UA: 0.2 E.U./dL

## 2017-05-15 MED ORDER — NITROFURANTOIN MONOHYD MACRO 100 MG PO CAPS: 100.0000 mg | ORAL_CAPSULE | Freq: Two times a day (BID) | ORAL | 0 refills | Status: DC

## 2017-05-15 NOTE — Progress Notes (Signed)
   Subjective:    Patient ID: Kimberly Buchanan, female    DOB: Nov 10, 1975, 41 y.o.   MRN: [id number]  HPI This is a 41 yo female who presents today with dysuria x 4 days, some odor. Feels a little better today after drinking fluids. No fever/chills, no increased back pain, some nausea with AZO. 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   Past Medical History:  Diagnosis Date  . Broken jaw (HCC)   . Hand swelling    No past surgical history on file. Family History  Problem Relation Age of Onset  . Hypertension Mother   . Arthritis Maternal Grandmother   . Cancer Maternal Grandmother        lung  . Hypertension Maternal Grandfather    Social History   Tobacco Use  . Smoking status: Former Games developermoker  . Smokeless tobacco: Never Used  Substance Use Topics  . Alcohol use: No  . Drug use: No      Review of Systems Per HPI    Objective:   Physical Exam Physical Exam  Constitutional: She is oriented to person, place, and time. She appears well-developed and well-nourished. No distress.  HENT:  Head: Normocephalic and atraumatic.  Cardiovascular: Normal rate, regular rhythm and normal heart sounds.   Pulmonary/Chest: Effort normal and breath sounds normal.  Abdominal: Soft. She exhibits no distension. There is no tenderness. There is no rebound, no guarding and no CVA tenderness.  Neurological: She is alert and oriented to person, place, and time.  Skin: Skin is warm and dry. She is not diaphoretic.  Psychiatric: She has a normal mood and affect. Her behavior is normal. Judgment and thought content normal.  Vitals reviewed.  BP 94/60 (BP Location: Right Arm, Patient Position: Sitting, Cuff Size: Normal)   Pulse (!) 56   Temp 98 F (36.7 C) (Oral)   Wt 164 lb (74.4 kg)   SpO2 98%   BMI 23.53 kg/m  Wt Readings from Last 3 Encounters:  05/15/17 164 lb (74.4 kg)  03/13/17 163 lb 12.8 oz (74.3 kg)    02/27/17 166 lb 3.2 oz (75.4 kg)   Results for orders placed or performed in visit on 05/15/17  POCT urinalysis dipstick  Result Value Ref Range   Color, UA yellow    Clarity, UA clear    Glucose, UA neg    Bilirubin, UA neg    Ketones, UA neg    Spec Grav, UA 1.010 1.010 - 1.025   Blood, UA neg    pH, UA 6.0 5.0 - 8.0   Protein, UA neg    Urobilinogen, UA 0.2 0.2 or 1.0 E.U./dL   Nitrite, UA neg    Leukocytes, UA Negative Negative   Appearance clear    Odor N/A        Assessment & Plan:  1. Dysuria - POCT urinalysis dipstick - Urine looks ok today, will send for culture as she has had positive culture earlier this year.  - symptoms improved today, provided antibiotic if symptoms worsen while awaiting culture or if culture positive - RTC/ER precautions reviewed - nitrofurantoin, macrocrystal-monohydrate, (MACROBID) 100 MG capsule; Take 1 capsule (100 mg total) by mouth 2 (two) times daily.  Dispense: 14 capsule; Refill: 0 - Urine Culture   Olean Reeeborah Gessner, FNP-BC   [facility]  05/15/2017 11:07 AM

## 2017-05-15 NOTE — Patient Instructions (Signed)
It was good to meet you today  If worse tomorrow, start antibiotic  I will notify you of culture results

## 2017-05-15 NOTE — Progress Notes (Signed)
POC  m

## 2017-05-18 LAB — URINE CULTURE
MICRO NUMBER: 81428215
SPECIMEN QUALITY: ADEQUATE

## 2017-05-27 ENCOUNTER — Other Ambulatory Visit: Payer: Self-pay | Admitting: Internal Medicine

## 2017-07-12 ENCOUNTER — Other Ambulatory Visit: Payer: Self-pay | Admitting: Neurosurgery

## 2017-07-23 NOTE — Pre-Procedure Instructions (Signed)
Kimberly Buchanan  07/23/2017      [facility] - [place] - [address] [place] [address] Phone: [phone] Fax: [phone]  [facility] - [place] - [address] [place] [address] Phone: [phone] Fax: [phone]    Your procedure is scheduled on Monday March 4.  [facility] Ad[facility] at 9:30 A.M.  Call this number if you have problems the morning of surgery:  [phone]   Remember:  Do not eat food or drink liquids after midnight.  Take these medicines the morning of surgery with A SIP OF WATER:  NONE  7 days prior to surgery STOP taking any Aspirin(unless otherwise instructed by your surgeon), Aleve, Naproxen, Ibuprofen, Motrin, Advil, Goody's, BC's, all herbal medications, fish oil, and all vitamins    Do not wear jewelry, make-up or nail polish.  Do not wear lotions, powders, or perfumes, or deodorant.  Do not shave 48 hours prior to surgery.  Men may shave face and neck.  Do not bring valuables to the hospital.  [facility] is not responsible for any belongings or valuables.  Contacts, dentures or bridgework may not be worn into surgery.  Leave your suitcase in the car.  After surgery it may be brought to your room.  For patients admitted to the hospital, discharge time will be determined by your treatment team.  Patients discharged the day of surgery will not be allowed to drive home.   Special instructions:    [facility]- Preparing For Surgery  Before surgery, you can play an important role. Because skin is not sterile, your skin needs to be as free of germs as possible. You can reduce the number of germs on your skin by washing with CHG (chlorahexidine gluconate) Soap before surgery.  CHG is an antiseptic cleaner which kills germs and bonds with the skin to continue killing germs even after washing.  Please do not use if you have an allergy to  CHG or antibacterial soaps. If your skin becomes reddened/irritated stop using the CHG.  Do not shave (including legs and underarms) for at least 48 hours prior to first CHG shower. It is OK to shave your face.  Please follow these instructions carefully.   1. Shower the NIGHT BEFORE SURGERY and the MORNING OF SURGERY with CHG.   2. If you chose to wash your hair, wash your hair first as usual with your normal shampoo.  3. After you shampoo, rinse your hair and body thoroughly to remove the shampoo.  4. Use CHG as you would any other liquid soap. You can apply CHG directly to the skin and wash gently with a scrungie or a clean washcloth.   5. Apply the CHG Soap to your body ONLY FROM THE NECK DOWN.  Do not use on open wounds or open sores. Avoid contact with your eyes, ears, mouth and genitals (private parts). Wash Face and genitals (private parts)  with your normal soap.  6. Wash thoroughly, paying special attention to the area where your surgery will be performed.  7. Thoroughly rinse your body with warm water from the neck down.  8. DO NOT shower/wash with your normal soap after using and rinsing off the CHG Soap.  9. Pat yourself dry with a CLEAN TOWEL.  10. Wear CLEAN PAJAMAS to bed the night before surgery, wear comfortable clothes the morning of surgery  11. Place CLEAN SHEETS on your bed the  night of your first shower and DO NOT SLEEP WITH PETS.    Day of Surgery: Do not apply any deodorants/lotions. Please wear clean clothes to the hospital/surgery center.      Please read over the following fact sheets that you were given. Coughing and Deep Breathing, MRSA Information and Surgical Site Infection Prevention

## 2017-07-24 ENCOUNTER — Encounter (HOSPITAL_COMMUNITY): Payer: Self-pay

## 2017-07-24 ENCOUNTER — Encounter (HOSPITAL_COMMUNITY)
Admission: RE | Admit: 2017-07-24 | Discharge: 2017-07-24 | Disposition: A | Discharge status: Home / Self Care | Payer: BLUE CROSS/BLUE SHIELD | Source: Ambulatory Visit | Attending: Neurosurgery | Admitting: Neurosurgery

## 2017-07-24 ENCOUNTER — Other Ambulatory Visit: Payer: Self-pay

## 2017-07-24 DIAGNOSIS — Z01812 Encounter for preprocedural laboratory examination: Secondary | ICD-10-CM | POA: Insufficient documentation

## 2017-07-24 HISTORY — DX: Other psychoactive substance abuse, uncomplicated: F19.10

## 2017-07-24 HISTORY — DX: Family history of other specified conditions: Z84.89

## 2017-07-24 LAB — BASIC METABOLIC PANEL
ANION GAP: 12 (ref 5–15)
BUN: 10 mg/dL (ref 6–20)
CHLORIDE: 100 mmol/L — AB (ref 101–111)
CO2: 25 mmol/L (ref 22–32)
Calcium: 9.5 mg/dL (ref 8.9–10.3)
Creatinine, Ser: 0.7 mg/dL (ref 0.44–1.00)
GFR calc non Af Amer: >60 mL/min (ref >60)
Glucose, Bld: 96 mg/dL (ref 65–99)
Potassium: 3.4 mmol/L — ABNORMAL LOW (ref 3.5–5.1)
Sodium: 137 mmol/L (ref 135–145)

## 2017-07-24 LAB — TYPE AND SCREEN
ABO/RH(D): O POS
Antibody Screen: NEGATIVE

## 2017-07-24 LAB — SURGICAL PCR SCREEN
MRSA, PCR: NEGATIVE
Staphylococcus aureus: NEGATIVE

## 2017-07-24 LAB — CBC
HCT: 43 % (ref 36.0–46.0)
HEMOGLOBIN: 14.6 g/dL (ref 12.0–15.0)
MCH: 31.3 pg (ref 26.0–34.0)
MCHC: 34 g/dL (ref 30.0–36.0)
MCV: 92.3 fL (ref 78.0–100.0)
Platelets: 281 10*3/uL (ref 150–400)
RBC: 4.66 MIL/uL (ref 3.87–5.11)
RDW: 12.3 % (ref 11.5–15.5)
WBC: 5.2 10*3/uL (ref 4.0–10.5)

## 2017-07-24 LAB — ABO/RH: ABO/RH(D): O POS

## 2017-07-24 NOTE — Progress Notes (Signed)
PCP - Kelli Hopeegina Bates - [facility] Cardi[facility] - denies  Chest x-ray - not neede EKG - not needed Stress Test - denies ECHO - denies Cardiac Cath - denies    Anesthesia review: NO  Patient denies shortness of breath, fever, cough and chest pain at PAT appointment   Patient verbalized understanding of instructions that were given to them at the PAT appointment. Patient was also instructed that they will need to review over the PAT instructions again at home before surgery.

## 2017-07-29 ENCOUNTER — Encounter (HOSPITAL_COMMUNITY)
Admission: RE | Disposition: A | Discharge status: Home / Self Care | Payer: Self-pay | Source: Ambulatory Visit | Attending: Neurosurgery

## 2017-07-29 ENCOUNTER — Inpatient Hospital Stay (HOSPITAL_COMMUNITY): Payer: BLUE CROSS/BLUE SHIELD | Admitting: Certified Registered"

## 2017-07-29 ENCOUNTER — Other Ambulatory Visit: Payer: Self-pay

## 2017-07-29 ENCOUNTER — Inpatient Hospital Stay (HOSPITAL_COMMUNITY): Payer: BLUE CROSS/BLUE SHIELD

## 2017-07-29 ENCOUNTER — Encounter (HOSPITAL_COMMUNITY): Payer: Self-pay | Admitting: Certified Registered"

## 2017-07-29 ENCOUNTER — Inpatient Hospital Stay (HOSPITAL_COMMUNITY)
Admission: RE | Admit: 2017-07-29 | Discharge: 2017-07-30 | DRG: 455 | Disposition: A | Discharge status: Home / Self Care | Payer: BLUE CROSS/BLUE SHIELD | Source: Ambulatory Visit | Attending: Neurosurgery | Admitting: Neurosurgery

## 2017-07-29 DIAGNOSIS — M48061 Spinal stenosis, lumbar region without neurogenic claudication: Secondary | ICD-10-CM | POA: Diagnosis present

## 2017-07-29 DIAGNOSIS — Z87891 Personal history of nicotine dependence: Secondary | ICD-10-CM | POA: Diagnosis not present

## 2017-07-29 DIAGNOSIS — M5116 Intervertebral disc disorders with radiculopathy, lumbar region: Secondary | ICD-10-CM | POA: Diagnosis present

## 2017-07-29 DIAGNOSIS — Z95828 Presence of other vascular implants and grafts: Secondary | ICD-10-CM

## 2017-07-29 DIAGNOSIS — Z79899 Other long term (current) drug therapy: Secondary | ICD-10-CM

## 2017-07-29 DIAGNOSIS — M7138 Other bursal cyst, other site: Secondary | ICD-10-CM | POA: Diagnosis present

## 2017-07-29 DIAGNOSIS — M545 Low back pain: Secondary | ICD-10-CM | POA: Diagnosis present

## 2017-07-29 DIAGNOSIS — M4316 Spondylolisthesis, lumbar region: Secondary | ICD-10-CM | POA: Diagnosis present

## 2017-07-29 DIAGNOSIS — Z419 Encounter for procedure for purposes other than remedying health state, unspecified: Secondary | ICD-10-CM

## 2017-07-29 LAB — POCT PREGNANCY, URINE: PREG TEST UR: NEGATIVE

## 2017-07-29 SURGERY — POSTERIOR LUMBAR FUSION 1 LEVEL
Anesthesia: General | Site: Spine Lumbar

## 2017-07-29 MED ORDER — SODIUM CHLORIDE 0.9 % IR SOLN
Status: DC | PRN
  Administered 2017-07-29: 500 mL

## 2017-07-29 MED ORDER — MAGNESIUM 500 MG PO TABS: 1000.0000 mg | ORAL_TABLET | Freq: Every day | ORAL | Status: DC

## 2017-07-29 MED ORDER — PROPOFOL 10 MG/ML IV BOLUS
INTRAVENOUS | Status: DC | PRN
  Administered 2017-07-29: 165 mg via INTRAVENOUS

## 2017-07-29 MED ORDER — CEFAZOLIN SODIUM-DEXTROSE 2-4 GM/100ML-% IV SOLN
INTRAVENOUS | Status: AC
  Filled 2017-07-29: qty 100

## 2017-07-29 MED ORDER — CEFAZOLIN SODIUM-DEXTROSE 2-4 GM/100ML-% IV SOLN
2.0000 g | INTRAVENOUS | Status: AC
  Administered 2017-07-29: 2 g via INTRAVENOUS

## 2017-07-29 MED ORDER — LIDOCAINE IN D5W 4-5 MG/ML-% IV SOLN
1.0000 mg/min | INTRAVENOUS | Status: AC
  Administered 2017-07-29: 25 ug/kg/min via INTRAVENOUS
  Filled 2017-07-29: qty 500

## 2017-07-29 MED ORDER — BUPIVACAINE LIPOSOME 1.3 % IJ SUSP
20.0000 mL | INTRAMUSCULAR | Status: AC
  Administered 2017-07-29: 20 mL
  Filled 2017-07-29: qty 20

## 2017-07-29 MED ORDER — MIDAZOLAM HCL 2 MG/2ML IJ SOLN
INTRAMUSCULAR | Status: AC
  Filled 2017-07-29: qty 2

## 2017-07-29 MED ORDER — BACITRACIN ZINC 500 UNIT/GM EX OINT
TOPICAL_OINTMENT | CUTANEOUS | Status: AC
  Filled 2017-07-29: qty 28.35

## 2017-07-29 MED ORDER — VANCOMYCIN HCL 1000 MG IV SOLR
INTRAVENOUS | Status: DC | PRN
  Administered 2017-07-29: 1000 mg via TOPICAL

## 2017-07-29 MED ORDER — CEFAZOLIN SODIUM-DEXTROSE 2-4 GM/100ML-% IV SOLN
2.0000 g | Freq: Three times a day (TID) | INTRAVENOUS | Status: AC
  Administered 2017-07-29 – 2017-07-30 (×2): 2 g via INTRAVENOUS
  Filled 2017-07-29 (×2): qty 100

## 2017-07-29 MED ORDER — MAGNESIUM GLUCONATE 500 MG PO TABS
1000.0000 mg | ORAL_TABLET | Freq: Every day | ORAL | Status: DC
  Administered 2017-07-30: 1000 mg via ORAL
  Filled 2017-07-29: qty 2

## 2017-07-29 MED ORDER — EPHEDRINE SULFATE-NACL 50-0.9 MG/10ML-% IV SOSY
PREFILLED_SYRINGE | INTRAVENOUS | Status: DC | PRN
  Administered 2017-07-29: 5 mg via INTRAVENOUS
  Administered 2017-07-29: 10 mg via INTRAVENOUS

## 2017-07-29 MED ORDER — THROMBIN 5000 UNITS EX SOLR
CUTANEOUS | Status: AC
  Filled 2017-07-29: qty 5000

## 2017-07-29 MED ORDER — BUPIVACAINE-EPINEPHRINE (PF) 0.5% -1:200000 IJ SOLN
INTRAMUSCULAR | Status: AC
  Filled 2017-07-29: qty 30

## 2017-07-29 MED ORDER — SUFENTANIL CITRATE 50 MCG/ML IV SOLN
INTRAVENOUS | Status: DC | PRN
  Administered 2017-07-29: 5 ug via INTRAVENOUS
  Administered 2017-07-29: 10 ug via INTRAVENOUS
  Administered 2017-07-29: 20 ug via INTRAVENOUS
  Administered 2017-07-29 (×3): 5 ug via INTRAVENOUS

## 2017-07-29 MED ORDER — LACTATED RINGERS IV SOLN
INTRAVENOUS | Status: DC
Start: 2017-07-29 — End: 2017-07-30
  Administered 2017-07-29 (×2): via INTRAVENOUS

## 2017-07-29 MED ORDER — ONDANSETRON HCL 4 MG/2ML IJ SOLN
INTRAMUSCULAR | Status: AC
  Filled 2017-07-29: qty 2

## 2017-07-29 MED ORDER — ZOLPIDEM TARTRATE 5 MG PO TABS: 5.0000 mg | ORAL_TABLET | Freq: Every evening | ORAL | Status: DC | PRN

## 2017-07-29 MED ORDER — LIDOCAINE 2% (20 MG/ML) 5 ML SYRINGE
INTRAMUSCULAR | Status: AC
  Filled 2017-07-29: qty 5

## 2017-07-29 MED ORDER — 0.9 % SODIUM CHLORIDE (POUR BTL) OPTIME
TOPICAL | Status: DC | PRN
  Administered 2017-07-29: 1000 mL

## 2017-07-29 MED ORDER — KETAMINE HCL-SODIUM CHLORIDE 100-0.9 MG/10ML-% IV SOSY
PREFILLED_SYRINGE | INTRAVENOUS | Status: AC
  Filled 2017-07-29: qty 10

## 2017-07-29 MED ORDER — SODIUM CHLORIDE 0.9% FLUSH: 3.0000 mL | INTRAVENOUS | Status: DC | PRN

## 2017-07-29 MED ORDER — CHLORHEXIDINE GLUCONATE CLOTH 2 % EX PADS: 6.0000 | MEDICATED_PAD | Freq: Once | CUTANEOUS | Status: DC

## 2017-07-29 MED ORDER — KETOROLAC TROMETHAMINE 30 MG/ML IJ SOLN
INTRAMUSCULAR | Status: DC | PRN
  Administered 2017-07-29: 30 mg via INTRAVENOUS

## 2017-07-29 MED ORDER — ROCURONIUM BROMIDE 10 MG/ML (PF) SYRINGE
PREFILLED_SYRINGE | INTRAVENOUS | Status: DC | PRN
  Administered 2017-07-29: 40 mg via INTRAVENOUS

## 2017-07-29 MED ORDER — PHENOL 1.4 % MT LIQD: 1.0000 | OROMUCOSAL | Status: DC | PRN

## 2017-07-29 MED ORDER — CYCLOBENZAPRINE HCL 10 MG PO TABS
10.0000 mg | ORAL_TABLET | Freq: Three times a day (TID) | ORAL | Status: DC | PRN
Start: 2017-07-29 — End: 2017-07-30
  Administered 2017-07-29 – 2017-07-30 (×2): 10 mg via ORAL
  Filled 2017-07-29 (×2): qty 1

## 2017-07-29 MED ORDER — ACETAMINOPHEN 650 MG RE SUPP: 650.0000 mg | RECTAL | Status: DC | PRN

## 2017-07-29 MED ORDER — ACETAMINOPHEN 325 MG PO TABS: 650.0000 mg | ORAL_TABLET | ORAL | Status: DC | PRN

## 2017-07-29 MED ORDER — MORPHINE SULFATE (PF) 4 MG/ML IV SOLN: 4.0000 mg | INTRAVENOUS | Status: DC | PRN

## 2017-07-29 MED ORDER — DEXAMETHASONE SODIUM PHOSPHATE 10 MG/ML IJ SOLN
INTRAMUSCULAR | Status: DC | PRN
  Administered 2017-07-29: 10 mg via INTRAVENOUS

## 2017-07-29 MED ORDER — DEXAMETHASONE SODIUM PHOSPHATE 10 MG/ML IJ SOLN
INTRAMUSCULAR | Status: AC
  Filled 2017-07-29: qty 1

## 2017-07-29 MED ORDER — ACETAMINOPHEN 10 MG/ML IV SOLN
INTRAVENOUS | Status: DC | PRN
  Administered 2017-07-29: 1000 mg via INTRAVENOUS

## 2017-07-29 MED ORDER — LIDOCAINE 2% (20 MG/ML) 5 ML SYRINGE
INTRAMUSCULAR | Status: DC | PRN
  Administered 2017-07-29: 100 mg via INTRAVENOUS

## 2017-07-29 MED ORDER — ONDANSETRON HCL 4 MG/2ML IJ SOLN
INTRAMUSCULAR | Status: DC | PRN
  Administered 2017-07-29: 4 mg via INTRAVENOUS

## 2017-07-29 MED ORDER — BISACODYL 10 MG RE SUPP: 10.0000 mg | Freq: Every day | RECTAL | Status: DC | PRN

## 2017-07-29 MED ORDER — ACETAMINOPHEN 10 MG/ML IV SOLN
INTRAVENOUS | Status: AC
  Filled 2017-07-29: qty 100

## 2017-07-29 MED ORDER — ONDANSETRON HCL 4 MG/2ML IJ SOLN: 4.0000 mg | Freq: Four times a day (QID) | INTRAMUSCULAR | Status: DC | PRN

## 2017-07-29 MED ORDER — ACETAMINOPHEN 500 MG PO TABS
1000.0000 mg | ORAL_TABLET | Freq: Four times a day (QID) | ORAL | Status: DC
  Administered 2017-07-29 – 2017-07-30 (×3): 1000 mg via ORAL
  Filled 2017-07-29 (×3): qty 2

## 2017-07-29 MED ORDER — OXYCODONE HCL 5 MG PO TABS
10.0000 mg | ORAL_TABLET | ORAL | Status: DC | PRN
  Administered 2017-07-29 – 2017-07-30 (×5): 10 mg via ORAL
  Filled 2017-07-29 (×5): qty 2

## 2017-07-29 MED ORDER — DOCUSATE SODIUM 100 MG PO CAPS
100.0000 mg | ORAL_CAPSULE | Freq: Two times a day (BID) | ORAL | Status: DC
  Administered 2017-07-29 – 2017-07-30 (×2): 100 mg via ORAL
  Filled 2017-07-29 (×2): qty 1

## 2017-07-29 MED ORDER — MIDAZOLAM HCL 5 MG/5ML IJ SOLN
INTRAMUSCULAR | Status: DC | PRN
  Administered 2017-07-29: 2 mg via INTRAVENOUS

## 2017-07-29 MED ORDER — VANCOMYCIN HCL 1000 MG IV SOLR
INTRAVENOUS | Status: AC
  Filled 2017-07-29: qty 1000

## 2017-07-29 MED ORDER — SODIUM CHLORIDE 0.9 % IV SOLN: 250.0000 mL | INTRAVENOUS | Status: DC

## 2017-07-29 MED ORDER — ONDANSETRON HCL 4 MG PO TABS: 4.0000 mg | ORAL_TABLET | Freq: Four times a day (QID) | ORAL | Status: DC | PRN

## 2017-07-29 MED ORDER — PROPOFOL 10 MG/ML IV BOLUS
INTRAVENOUS | Status: AC
  Filled 2017-07-29: qty 20

## 2017-07-29 MED ORDER — BUPRENORPHINE HCL-NALOXONE HCL 8-2 MG SL SUBL
1.0000 | SUBLINGUAL_TABLET | Freq: Two times a day (BID) | SUBLINGUAL | Status: DC
  Administered 2017-07-29 – 2017-07-30 (×2): 1 via SUBLINGUAL
  Filled 2017-07-29 (×2): qty 1

## 2017-07-29 MED ORDER — SUFENTANIL CITRATE 50 MCG/ML IV SOLN
INTRAVENOUS | Status: AC
Start: 2017-07-29 — End: 2017-07-29
  Filled 2017-07-29: qty 1

## 2017-07-29 MED ORDER — SODIUM CHLORIDE 0.9 % IJ SOLN
INTRAMUSCULAR | Status: AC
  Filled 2017-07-29: qty 10

## 2017-07-29 MED ORDER — HYDROMORPHONE HCL 1 MG/ML IJ SOLN: 0.2500 mg | INTRAMUSCULAR | Status: DC | PRN

## 2017-07-29 MED ORDER — KETOROLAC TROMETHAMINE 30 MG/ML IJ SOLN
INTRAMUSCULAR | Status: AC
  Filled 2017-07-29: qty 1

## 2017-07-29 MED ORDER — BUPIVACAINE-EPINEPHRINE (PF) 0.5% -1:200000 IJ SOLN
INTRAMUSCULAR | Status: DC | PRN
  Administered 2017-07-29: 10 mL

## 2017-07-29 MED ORDER — DEXMEDETOMIDINE HCL IN NACL 200 MCG/50ML IV SOLN
INTRAVENOUS | Status: AC
  Filled 2017-07-29: qty 50

## 2017-07-29 MED ORDER — ROCURONIUM BROMIDE 10 MG/ML (PF) SYRINGE
PREFILLED_SYRINGE | INTRAVENOUS | Status: AC
  Filled 2017-07-29: qty 5

## 2017-07-29 MED ORDER — GELATIN ABSORBABLE MT POWD
OROMUCOSAL | Status: DC | PRN
  Administered 2017-07-29: 5 mL via TOPICAL

## 2017-07-29 MED ORDER — SODIUM CHLORIDE 0.9 % IV SOLN
0.1000 mg/kg/h | INTRAVENOUS | Status: DC
  Administered 2017-07-29: .3 mg/kg/h via INTRAVENOUS
  Filled 2017-07-29: qty 2

## 2017-07-29 MED ORDER — SODIUM CHLORIDE 0.9% FLUSH: 3.0000 mL | Freq: Two times a day (BID) | INTRAVENOUS | Status: DC

## 2017-07-29 MED ORDER — BACITRACIN ZINC 500 UNIT/GM EX OINT
TOPICAL_OINTMENT | CUTANEOUS | Status: DC | PRN
  Administered 2017-07-29: 1 via TOPICAL

## 2017-07-29 MED ORDER — CHLORTHALIDONE 25 MG PO TABS
25.0000 mg | ORAL_TABLET | Freq: Every day | ORAL | Status: DC
  Administered 2017-07-30: 25 mg via ORAL
  Filled 2017-07-29: qty 1

## 2017-07-29 MED ORDER — VALACYCLOVIR HCL 500 MG PO TABS: 1000.0000 mg | ORAL_TABLET | Freq: Every day | ORAL | Status: DC | PRN

## 2017-07-29 MED ORDER — OXYCODONE HCL 5 MG PO TABS: 5.0000 mg | ORAL_TABLET | ORAL | Status: DC | PRN

## 2017-07-29 MED ORDER — MENTHOL 3 MG MT LOZG: 1.0000 | LOZENGE | OROMUCOSAL | Status: DC | PRN

## 2017-07-29 SURGICAL SUPPLY — 76 items
ADH SKN CLS APL DERMABOND .7 (GAUZE/BANDAGES/DRESSINGS) ×1
APL SKNCLS STERI-STRIP NONHPOA (GAUZE/BANDAGES/DRESSINGS) ×1
BAG DECANTER FOR FLEXI CONT (MISCELLANEOUS) ×2 IMPLANT
BENZOIN TINCTURE PRP APPL 2/3 (GAUZE/BANDAGES/DRESSINGS) ×2 IMPLANT
BLADE 10 SAFETY STRL DISP (BLADE) ×1 IMPLANT
BLADE CLIPPER SURG (BLADE) IMPLANT
BUR MATCHSTICK NEURO 3.0 LAGG (BURR) ×2 IMPLANT
BUR PRECISION FLUTE 6.0 (BURR) ×2 IMPLANT
CAGE ALTERA 10X31X9-13 15D (Cage) ×1 IMPLANT
CANISTER SUCT 3000ML PPV (MISCELLANEOUS) ×2 IMPLANT
CAP REVERE LOCKING (Cap) ×4 IMPLANT
CARTRIDGE OIL MAESTRO DRILL (MISCELLANEOUS) ×1 IMPLANT
CONT SPEC 4OZ CLIKSEAL STRL BL (MISCELLANEOUS) ×2 IMPLANT
COVER BACK TABLE 60X90IN (DRAPES) ×3 IMPLANT
DECANTER SPIKE VIAL GLASS SM (MISCELLANEOUS) ×2 IMPLANT
DERMABOND ADVANCED (GAUZE/BANDAGES/DRESSINGS) ×1
DERMABOND ADVANCED .7 DNX12 (GAUZE/BANDAGES/DRESSINGS) IMPLANT
DIFFUSER DRILL AIR PNEUMATIC (MISCELLANEOUS) ×2 IMPLANT
DRAPE C-ARM 42X72 X-RAY (DRAPES) ×5 IMPLANT
DRAPE HALF SHEET 40X57 (DRAPES) ×5 IMPLANT
DRAPE LAPAROTOMY 100X72X124 (DRAPES) ×2 IMPLANT
DRAPE SURG 17X23 STRL (DRAPES) ×8 IMPLANT
DRSG OPSITE POSTOP 4X6 (GAUZE/BANDAGES/DRESSINGS) ×1 IMPLANT
ELECT BLADE 4.0 EZ CLEAN MEGAD (MISCELLANEOUS) ×2
ELECT REM PT RETURN 9FT ADLT (ELECTROSURGICAL) ×2
ELECTRODE BLDE 4.0 EZ CLN MEGD (MISCELLANEOUS) ×1 IMPLANT
ELECTRODE REM PT RTRN 9FT ADLT (ELECTROSURGICAL) ×1 IMPLANT
EVACUATOR 1/8 PVC DRAIN (DRAIN) IMPLANT
GAUZE SPONGE 4X4 12PLY STRL (GAUZE/BANDAGES/DRESSINGS) ×1 IMPLANT
GAUZE SPONGE 4X4 16PLY XRAY LF (GAUZE/BANDAGES/DRESSINGS) ×1 IMPLANT
GLOVE BIO SURGEON STRL SZ8 (GLOVE) ×5 IMPLANT
GLOVE BIO SURGEON STRL SZ8.5 (GLOVE) ×4 IMPLANT
GLOVE BIOGEL PI IND STRL 6.5 (GLOVE) IMPLANT
GLOVE BIOGEL PI IND STRL 7.0 (GLOVE) IMPLANT
GLOVE BIOGEL PI IND STRL 7.5 (GLOVE) IMPLANT
GLOVE BIOGEL PI INDICATOR 6.5 (GLOVE) ×2
GLOVE BIOGEL PI INDICATOR 7.0 (GLOVE) ×2
GLOVE BIOGEL PI INDICATOR 7.5 (GLOVE) ×1
GLOVE ECLIPSE 7.5 STRL STRAW (GLOVE) ×1 IMPLANT
GLOVE EXAM NITRILE LRG STRL (GLOVE) IMPLANT
GLOVE EXAM NITRILE XL STR (GLOVE) IMPLANT
GLOVE EXAM NITRILE XS STR PU (GLOVE) IMPLANT
GLOVE SURG SS PI 6.0 STRL IVOR (GLOVE) ×5 IMPLANT
GOWN STRL REUS W/ TWL LRG LVL3 (GOWN DISPOSABLE) IMPLANT
GOWN STRL REUS W/ TWL XL LVL3 (GOWN DISPOSABLE) ×2 IMPLANT
GOWN STRL REUS W/TWL 2XL LVL3 (GOWN DISPOSABLE) ×1 IMPLANT
GOWN STRL REUS W/TWL LRG LVL3 (GOWN DISPOSABLE) ×4
GOWN STRL REUS W/TWL XL LVL3 (GOWN DISPOSABLE) ×6
HEMOSTAT POWDER KIT SURGIFOAM (HEMOSTASIS) ×2 IMPLANT
KIT BASIN OR (CUSTOM PROCEDURE TRAY) ×2 IMPLANT
KIT ROOM TURNOVER OR (KITS) ×2 IMPLANT
MILL MEDIUM DISP (BLADE) ×2 IMPLANT
NDL HYPO 21X1.5 SAFETY (NEEDLE) IMPLANT
NEEDLE HYPO 21X1.5 SAFETY (NEEDLE) ×2 IMPLANT
NEEDLE HYPO 22GX1.5 SAFETY (NEEDLE) ×2 IMPLANT
NS IRRIG 1000ML POUR BTL (IV SOLUTION) ×2 IMPLANT
OIL CARTRIDGE MAESTRO DRILL (MISCELLANEOUS) ×2
PACK LAMINECTOMY NEURO (CUSTOM PROCEDURE TRAY) ×2 IMPLANT
PAD ARMBOARD 7.5X6 YLW CONV (MISCELLANEOUS) ×8 IMPLANT
PATTIES SURGICAL .5 X1 (DISPOSABLE) IMPLANT
ROD REVERE CURVED 6.35X35MM (Rod) ×2 IMPLANT
SCREW 7.5X50MM (Screw) ×4 IMPLANT
SLEEVE SURGEON STRL (DRAPES) ×1 IMPLANT
SPONGE LAP 4X18 X RAY DECT (DISPOSABLE) IMPLANT
SPONGE NEURO XRAY DETECT 1X3 (DISPOSABLE) IMPLANT
SPONGE SURGIFOAM ABS GEL 100 (HEMOSTASIS) IMPLANT
STRIP BIOACTIVE 20CC 25X100X8 (Miscellaneous) ×1 IMPLANT
STRIP CLOSURE SKIN 1/2X4 (GAUZE/BANDAGES/DRESSINGS) ×2 IMPLANT
SUT VIC AB 1 CT1 18XBRD ANBCTR (SUTURE) ×2 IMPLANT
SUT VIC AB 1 CT1 8-18 (SUTURE) ×2
SUT VIC AB 2-0 CP2 18 (SUTURE) ×3 IMPLANT
SYR 20CC LL (SYRINGE) ×1 IMPLANT
TOWEL GREEN STERILE (TOWEL DISPOSABLE) ×2 IMPLANT
TOWEL GREEN STERILE FF (TOWEL DISPOSABLE) ×2 IMPLANT
TRAY FOLEY W/METER SILVER 16FR (SET/KITS/TRAYS/PACK) ×2 IMPLANT
WATER STERILE IRR 1000ML POUR (IV SOLUTION) ×2 IMPLANT

## 2017-07-29 NOTE — Transfer of Care (Signed)
Immediate Anesthesia Transfer of Care Note  Patient: Kimberly Buchanan  Procedure(s) Performed: POSTERIOR LUMBAR INTERBODY FUSION, INTERBODY PROSTHESIS, POSTERIOR INSTRUMENTATION AND FUSION LUMBAR FOUR- LUMBAR FIVE (N/A Spine Lumbar)  Patient Location: PACU  Anesthesia Type:General  Level of Consciousness: awake, alert  and oriented  Airway & Oxygen Therapy: Patient Spontanous Breathing  Post-op Assessment: Report given to RN and Patient moving all extremities X 4  Post vital signs: Reviewed and stable  Last Vitals:  Vitals:   07/29/17 1645 07/29/17 1659  BP: 122/81 123/83  Pulse: 72 75  Resp: 11 14  Temp:    SpO2: 100% 98%    Last Pain:  Vitals:   07/29/17 1630  TempSrc:   PainSc: 10-Worst pain ever      Patients Stated Pain Goal: 3 (07/29/17 0951)  Complications: No apparent anesthesia complications

## 2017-07-29 NOTE — Progress Notes (Signed)
Patient ID: Kimberly LedererElizabeth L Buchanan, female   DOB: Jul 24, 1975, 42 y.o.   MRN: [id number] Subjective: The patient is alert and pleasant.  She is in no apparent distress.  Objective: Vital signs in last 24 hours: Temp:  [98.4 F (36.9 C)-98.8 F (37.1 C)] 98.8 F (37.1 C) (03/04 1630) Pulse Rate:  [60-78] 75 (03/04 1659) Resp:  [11-22] 14 (03/04 1659) BP: (96-127)/(72-83) 123/83 (03/04 1659) SpO2:  [97 %-100 %] 98 % (03/04 1659) Weight:  [71.2 kg (157 lb)] 71.2 kg (157 lb) (03/04 0934) Estimated body mass index is 22.53 kg/m as calculated from the following:   Height as of this encounter: 5\' 10"  (1.778 m).   Weight as of this encounter: 71.2 kg (157 lb).   Intake/Output from previous day: No intake/output data recorded. Intake/Output this shift: Total I/O In: 1000 [I.V.:1000] Out: 550 [Urine:350; Blood:200]  Physical exam patient is alert and pleasant.  She is moving her lower extremities well.  Lab Results: No results for input(s): WBC, HGB, HCT, PLT in the last 72 hours. BMET No results for input(s): NA, K, CL, CO2, GLUCOSE, BUN, CREATININE, CALCIUM in the last 72 hours.  Studies/Results: Dg Lumbar Spine 2-3 Views  Result Date: 07/29/2017 CLINICAL DATA:  L4-5 PLIF EXAM: DG C-ARM 61-120 MIN; LUMBAR SPINE - 2-3 VIEW COMPARISON:  None. FLUOROSCOPY TIME:  27 seconds FINDINGS: Posterior lumbar interbody fusion at L4-5. Degenerative disc disease with disc height loss at L5-S1. IMPRESSION: Intraoperative localization as described above. Electronically Signed   By: Elige KoHetal  Patel   On: 07/29/2017 16:04   Dg Lumbar Spine 1 View  Result Date: 07/29/2017 CLINICAL DATA:  Localization for L4-L5 PLIF EXAM: LUMBAR SPINE - 1 VIEW COMPARISON:  MR lumbar spine 03/10/2017 FINDINGS: Single lateral radiograph demonstrates a curved tip probe posterior to the L4 vertebral body. IMPRESSION: Intraoperative view as above. Electronically Signed   By: Genevive BiStewart  Edmunds M.D.   On: 07/29/2017 16:04   Dg C-arm  1-60 Min  Result Date: 07/29/2017 CLINICAL DATA:  L4-5 PLIF EXAM: DG C-ARM 61-120 MIN; LUMBAR SPINE - 2-3 VIEW COMPARISON:  None. FLUOROSCOPY TIME:  27 seconds FINDINGS: Posterior lumbar interbody fusion at L4-5. Degenerative disc disease with disc height loss at L5-S1. IMPRESSION: Intraoperative localization as described above. Electronically Signed   By: Elige KoHetal  Patel   On: 07/29/2017 16:04    Assessment/Plan: The patient is doing well.  I spoke with her husband and mother.  LOS: 0 days     Cristi LoronJeffrey D Miasha Emmons 07/29/2017, 5:16 PM

## 2017-07-29 NOTE — Plan of Care (Signed)
  Progressing Safety: Ability to remain free from injury will improve 07/29/2017 2048 - Progressing by Cephus RicherAguilar, Dlynn Ranes D, RN Activity: Ability to avoid complications of mobility impairment will improve 07/29/2017 2048 - Progressing by Mel AlmondAguilar, Jaira Canady D, RN Ability to tolerate increased activity will improve 07/29/2017 2048 - Progressing by Mel AlmondAguilar, Ellean Firman D, RN Will remain free from falls 07/29/2017 2048 - Progressing by Threasa BeardsAguilar, Mariabelen Pressly D, RN Bowel/Gastric: Gastrointestinal status for postoperative course will improve 07/29/2017 2048 - Progressing by Mel AlmondAguilar, Deondra Labrador D, RN Education: Ability to verbalize activity precautions or restrictions will improve 07/29/2017 2048 - Progressing by Threasa BeardsAguilar, Vyron Fronczak D, RN Knowledge of the prescribed therapeutic regimen will improve 07/29/2017 2048 - Progressing by Mel AlmondAguilar, Jatinder Mcdonagh D, RN Understanding of discharge needs will improve 07/29/2017 2048 - Progressing by Mel AlmondAguilar, Wayde Gopaul D, RN Physical Regulation: Ability to maintain clinical measurements within normal limits will improve 07/29/2017 2048 - Progressing by Cephus RicherAguilar, Keiyon Plack D, RN Postoperative complications will be avoided or minimized 07/29/2017 2048 - Progressing by Mel AlmondAguilar, Bliss Tsang D, RN Diagnostic test results will improve 07/29/2017 2048 - Progressing by Mel AlmondAguilar, Memory Heinrichs D, RN Pain Management: Pain level will decrease 07/29/2017 2048 - Progressing by Mel AlmondAguilar, Corinn Stoltzfus D, RN Skin Integrity: Signs of wound healing will improve 07/29/2017 2048 - Progressing by Threasa BeardsAguilar, Denym Rahimi D, RN Health Behavior/Discharge Planning: Identification of resources available to assist in meeting health care needs will improve 07/29/2017 2048 - Progressing by Threasa BeardsAguilar, Cashmere Dingley D, RN Bladder/Genitourinary: Urinary functional status for postoperative course will improve 07/29/2017 2048 - Progressing by Mel AlmondAguilar, Micaiah Litle D, RN

## 2017-07-29 NOTE — Op Note (Signed)
Brief history: The patient is a 42 year old white female who has complained of back, buttock and leg pain consistent with neurogenic claudication.  She has failed medical management and was worked up with a lumbar MRI and lumbar x-rays.  These studies demonstrated an L4-5 spondylolisthesis, spinal stenosis, facet arthropathy and spinal stenosis.  I discussed the various treatment options with the patient including surgery.  She has weighed the risks, benefits, and alternatives of surgery and decided to proceed with an L4-5 decompression, instrumentation, and fusion.  Preoperative diagnosis: L4-5 spondylolisthesis, facet arthropathy, degenerative disc disease, spinal stenosis compressing both the L4 and the L5 nerve roots; lumbago; lumbar radiculopathy; neurogenic claudication  Postoperative diagnosis: The same  Procedure: Bilateral L4-5 laminotomy/foraminotomies to decompress the bilateral L4 and L5 nerve roots(the work required to do this was in addition to the work required to do the posterior lumbar interbody fusion because of the patient's spinal stenosis, facet arthropathy. Etc. requiring a wide decompression of the nerve roots.);  L4-5 transforaminal lumbar interbody fusion with local morselized autograft bone and Kinnex graft extender; insertion of interbody prosthesis at L4-5 (globus peek expandable interbody prosthesis); posterior nonsegmental instrumentation from L4 to L5 with globus titanium pedicle screws and rods; posterior lateral arthrodesis at L4-5 with local morselized autograft bone and Kinnex bone graft extender.  Surgeon: Dr. Delma Officer  Asst.: Dr. Barnett Abu  Anesthesia: Gen. endotracheal  Estimated blood loss: 200 cc  Drains: None  Complications: None  Description of procedure: The patient was brought to the operating room by the anesthesia team. General endotracheal anesthesia was induced. The patient was turned to the prone position on the Wilson frame. The patient's  lumbosacral region was then prepared with Betadine scrub and Betadine solution. Sterile drapes were applied.  I then injected the area to be incised with Marcaine with epinephrine solution. I then used the scalpel to make a linear midline incision over the L4-5 interspace. I then used electrocautery to perform a bilateral subperiosteal dissection exposing the spinous process and lamina of L4 and L5. We then obtained intraoperative radiograph to confirm our location. We then inserted the Verstrac retractor to provide exposure.  I began the decompression by using the high speed drill to perform laminotomies at L4-5 bilaterally. We then used the Kerrison punches to widen the laminotomy and removed the ligamentum flavum and synovial cyst at L4-5 bilaterally. We used the Kerrison punches to remove the medial facets at L4-5. We performed wide foraminotomies about the bilateral L4 and L5 nerve roots completing the decompression.  We now turned our attention to the posterior lumbar interbody fusion. I used a scalpel to incise the intervertebral disc at L4-5 bilaterally. I then performed a partial intervertebral discectomy at L4-5 bilaterally using the pituitary forceps. We prepared the vertebral endplates at L4-5 bilaterally for the fusion by removing the soft tissues with the curettes. We then used the trial spacers to pick the appropriate sized interbody prosthesis. We prefilled his prosthesis with a combination of local morselized autograft bone that we obtained during the decompression as well as Kinnex bone graft extender. We inserted the prefilled prosthesis into the interspace at L4-5, we then expanded the prosthesis. There was a good snug fit of the prosthesis in the interspace. We then filled and the remainder of the intervertebral disc space with local morselized autograft bone and Kinnex. This completed the posterior lumbar interbody arthrodesis.  We now turned attention to the instrumentation. Under  fluoroscopic guidance we cannulated the bilateral L4 and L5 pedicles with  the bone probe. We then removed the bone probe. We then tapped the pedicle with a 6.5 millimeter tap. We then removed the tap. We probed inside the tapped pedicle with a ball probe to rule out cortical breaches. We then inserted a 7.5 x 50 millimeter pedicle screw into the L4 and L5 pedicles bilaterally under fluoroscopic guidance. We then palpated along the medial aspect of the pedicles to rule out cortical breaches. There were none. The nerve roots were not injured. We then connected the unilateral pedicle screws with a lordotic rod. We compressed the construct and secured the rod in place with the caps. We then tightened the caps appropriately. This completed the instrumentation from 4 5 bilaterally.  We now turned our attention to the posterior lateral arthrodesis at L4-5 bilaterally. We used the high-speed drill to decorticate the remainder of the facets, pars, transverse process at L4-5 bilaterally. We then applied a combination of local morselized autograft bone and Kinnex bone graft extender over these decorticated posterior lateral structures. This completed the posterior lateral arthrodesis.  We then obtained hemostasis using bipolar electrocautery. We irrigated the wound out with bacitracin solution. We inspected the thecal sac and nerve roots and noted they were well decompressed. We then removed the retractor. We placed vancomycin powder in the wound. We reapproximated patient's thoracolumbar fascia with interrupted #1 Vicryl suture. We reapproximated patient's subcutaneous tissue with interrupted 2-0 Vicryl suture. The reapproximated patient's skin with Steri-Strips and benzoin. The wound was then coated with bacitracin ointment. A sterile dressing was applied. The drapes were removed. The patient was subsequently returned to the supine position where they were extubated by the anesthesia team. He was then transported to the  post anesthesia care unit in stable condition. All sponge instrument and needle counts were reportedly correct at the end of this case.

## 2017-07-29 NOTE — H&P (Signed)
Subjective: The patient is a 42 year old white female who has complained of back, hip and leg pain consistent with neurogenic claudication.  She has failed medical management and was worked up with a lumbar MRI and lumbar x-rays.  These demonstrated an L4-5 spondylolisthesis, severe facet arthropathy and spinal stenosis.  I discussed the various treatment options with the patient including surgery.  She has decided proceed with an L4-5 decompression, instrumentation and fusion.  Past Medical History:  Diagnosis Date  . Broken jaw (HCC)   . Family history of adverse reaction to anesthesia    mom has PONV  . Hand swelling   . 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 MANDIBLE FRACTURE SURGERY      Allergies  Allergen Reactions  . Sulfa Antibiotics Anaphylaxis and Hives    Social History   Tobacco Use  . Smoking status: Former Smoker    Last attempt to quit: 07/25/2015    Years since quitting: 2.0  . Smokeless tobacco: Never Used  Substance Use Topics  . Alcohol use: No    Family History  Problem Relation Age of Onset  . Hypertension Mother   . Arthritis Maternal Grandmother   . Cancer Maternal Grandmother        lung  . Hypertension Maternal Grandfather    Prior to Admission medications   Medication Sig Start Date End Date Taking? Authorizing Provider  b complex vitamins tablet Take 1 tablet daily by mouth.   Yes [provider]  Biotin 1000 MCG tablet Take 1,000 mcg by mouth daily.   Yes [provider]  buprenorphine-naloxone (SUBOXONE) 8-2 mg SUBL SL tablet Place 1 tablet under the tongue 2 (two) times daily.   Yes [provider]  CALCIUM-MAGNESIUM PO Take 1 tablet by mouth at bedtime.   Yes [provider]  chlorthalidone (HYGROTON) 25 MG tablet Take 1 tablet (25 mg total) by mouth daily. MUST SCHEDULE ANNUAL PHYSICAL 05/27/17  Yes Lorre Munroe, NP  cholecalciferol  (VITAMIN D) 1000 units tablet Take 1,000 Units daily by mouth.   Yes [provider]  ibuprofen (ADVIL,MOTRIN) 200 MG tablet Take 800 mg by mouth 4 (four) times daily as needed for moderate pain.    Yes [provider]  Magnesium 500 MG TABS Take 1,000 mg by mouth daily.    Yes [provider]  nitrofurantoin, macrocrystal-monohydrate, (MACROBID) 100 MG capsule Take 1 capsule (100 mg total) by mouth 2 (two) times daily. Patient not taking: Reported on 07/16/2017 05/15/17   Emi Belfast, FNP  valACYclovir (VALTREX) 1000 MG tablet Take 1,000 mg by mouth daily as needed (cold sores).  04/10/17   [provider]     Review of Systems  Positive ROS: As above  All other systems have been reviewed and were otherwise negative with the exception of those mentioned in the HPI and as above.  Objective: Vital signs in last 24 hours: Temp:  [98.4 F (36.9 C)] 98.4 F (36.9 C) (03/04 0934) Pulse Rate:  [60] 60 (03/04 0934) Resp:  [18] 18 (03/04 0934) BP: (96)/(72) 96/72 (03/04 0934) SpO2:  [97 %] 97 % (03/04 0934) Weight:  [71.2 kg (157 lb)] 71.2 kg (157 lb) (03/04 0934) Estimated body mass index is 22.53 kg/m as calculated from the following:   Height as of this encounter: 5\' 10"  (1.778 m).   Weight as of this encounter: 71.2 kg (  157 lb).   General Appearance: Alert Head: Normocephalic, without obvious abnormality, atraumatic Eyes: PERRL, conjunctiva/corneas clear, EOM's intact,    Ears: Normal  Throat: Normal  Neck: Supple, Back: unremarkable Lungs: Clear to auscultation bilaterally, respirations unlabored Heart: Regular rate and rhythm, no murmur, rub or gallop Abdomen: Soft, non-tender Extremities: Extremities normal, atraumatic, no cyanosis or edema Skin: unremarkable  NEUROLOGIC:   Mental status: alert and oriented,Motor Exam - grossly normal Sensory Exam - grossly normal Reflexes:  Coordination - grossly normal Gait - grossly  normal Balance - grossly normal Cranial Nerves: I: smell Not tested  II: visual acuity  OS: Normal  OD: Normal   II: visual fields Full to confrontation  II: pupils Equal, round, reactive to light  III,VII: ptosis None  III,IV,VI: extraocular muscles  Full ROM  V: mastication Normal  V: facial light touch sensation  Normal  V,VII: corneal reflex  Present  VII: facial muscle function - upper  Normal  VII: facial muscle function - lower Normal  VIII: hearing Not tested  IX: soft palate elevation  Normal  IX,X: gag reflex Present  XI: trapezius strength  5/5  XI: sternocleidomastoid strength 5/5  XI: neck flexion strength  5/5  XII: tongue strength  Normal    Data Review Lab Results  Component Value Date   WBC 5.2 07/24/2017   HGB 14.6 07/24/2017   HCT 43.0 07/24/2017   MCV 92.3 07/24/2017   PLT 281 07/24/2017   Lab Results  Component Value Date   NA 137 07/24/2017   K 3.4 (L) 07/24/2017   CL 100 (L) 07/24/2017   CO2 25 07/24/2017   BUN 10 07/24/2017   CREATININE 0.70 07/24/2017   GLUCOSE 96 07/24/2017   No results found for: INR, PROTIME  Assessment/Plan: L4-5 spondylolisthesis, spinal stenosis, facet arthropathy, lumbar radiculopathy, lumbago, neurogenic claudication: I have discussed the situation with the patient and reviewed her imaging studies with her.  We have discussed the various treatment options including surgery.  I have described the surgical treatment option of an L4-5 decompression, nstrumentation, and fusion.  I have shown her surgical models.  I have given her a surgical pamphlet.  We have discussed the risks, benefits, alternatives, expected postoperative course, and likelihood of achieving our goals with surgery.  I have answered all her questions.  She has decided to proceed with surgery.   Cristi LoronJeffrey D Xzavier Swinger 07/29/2017 12:06 PM

## 2017-07-29 NOTE — Anesthesia Preprocedure Evaluation (Addendum)
Anesthesia Evaluation  Patient identified by MRN, date of birth, ID band Patient awake    Reviewed: Allergy & Precautions, H&P , Patient's Chart, lab work & pertinent test results, reviewed documented beta blocker date and time   Airway Mallampati: II  TM Distance: >3 FB Neck ROM: full    Dental no notable dental hx. (+) Teeth Intact, Dental Advisory Given   Pulmonary former smoker,    Pulmonary exam normal breath sounds clear to auscultation       Cardiovascular  Rhythm:regular Rate:Normal     Neuro/Psych    GI/Hepatic   Endo/Other    Renal/GU      Musculoskeletal   Abdominal   Peds  Hematology   Anesthesia Other Findings   Reproductive/Obstetrics                            Anesthesia Physical Anesthesia Plan  ASA: II  Anesthesia Plan: General   Post-op Pain Management:    Induction: Intravenous  PONV Risk Score and Plan: 2 and Dexamethasone, Ondansetron and Treatment may vary due to age or medical condition  Airway Management Planned: Oral ETT  Additional Equipment: None  Intra-op Plan:   Post-operative Plan: Extubation in OR  Informed Consent: I have reviewed the patients History and Physical, chart, labs and discussed the procedure including the risks, benefits and alternatives for the proposed anesthesia with the patient or authorized representative who has indicated his/her understanding and acceptance.   Dental Advisory Given  Plan Discussed with: CRNA, Surgeon and Anesthesiologist  Anesthesia Plan Comments: (  )       Anesthesia Quick Evaluation

## 2017-07-29 NOTE — Anesthesia Postprocedure Evaluation (Signed)
Anesthesia Post Note  Patient: Cruzita Ledererlizabeth L Todt  Procedure(s) Performed: POSTERIOR LUMBAR INTERBODY FUSION, INTERBODY PROSTHESIS, POSTERIOR INSTRUMENTATION AND FUSION LUMBAR FOUR- LUMBAR FIVE (N/A Spine Lumbar)     Patient location during evaluation: PACU Anesthesia Type: General Level of consciousness: awake and alert Pain management: pain level controlled Vital Signs Assessment: post-procedure vital signs reviewed and stable Respiratory status: spontaneous breathing, nonlabored ventilation, respiratory function stable and patient connected to nasal cannula oxygen Cardiovascular status: blood pressure returned to baseline and stable Postop Assessment: no apparent nausea or vomiting Anesthetic complications: no    Last Vitals:  Vitals:   07/29/17 1700 07/29/17 1714  BP:  117/78  Pulse: 73 68  Resp: 14 12  Temp:    SpO2: 96% 96%    Last Pain:  Vitals:   07/29/17 1700  TempSrc:   PainSc: 10-Worst pain ever                 Gardiner Espana DAVID

## 2017-07-29 NOTE — Anesthesia Procedure Notes (Signed)
Procedure Name: Intubation Date/Time: 07/29/2017 12:33 PM Performed by: Moshe Salisbury, CRNA Pre-anesthesia Checklist: Patient identified, Emergency Drugs available, Suction available and Patient being monitored Patient Re-evaluated:Patient Re-evaluated prior to induction Oxygen Delivery Method: Circle System Utilized Preoxygenation: Pre-oxygenation with 100% oxygen Induction Type: IV induction Ventilation: Mask ventilation without difficulty Laryngoscope Size: Mac and 3 Grade View: Grade II Tube type: Oral Tube size: 7.5 mm Number of attempts: 1 Airway Equipment and Method: Stylet Placement Confirmation: ETT inserted through vocal cords under direct vision,  positive ETCO2 and breath sounds checked- equal and bilateral Secured at: 22 cm Tube secured with: Tape Dental Injury: Teeth and Oropharynx as per pre-operative assessment

## 2017-07-29 NOTE — Anesthesia Procedure Notes (Signed)
Central Venous Catheter Insertion Performed by: Kipp BroodJoslin, Amarria Andreasen, MD, anesthesiologist Start/End3/08/2017 4:50 PM, 07/29/2017 5:00 PM Patient location: PACU. Preanesthetic checklist: patient identified, IV checked, site marked, risks and benefits discussed, surgical consent, monitors and equipment checked, pre-op evaluation, timeout performed and anesthesia consent Position: Trendelenburg Lidocaine 1% used for infiltration and patient sedated Hand hygiene performed , maximum sterile barriers used  and Seldinger technique used Catheter size: 8 Fr Total catheter length 16. Central line was placed.Double lumen Procedure performed using ultrasound guided technique. Ultrasound Notes:anatomy identified, needle tip was noted to be adjacent to the nerve/plexus identified, no ultrasound evidence of intravascular and/or intraneural injection and image(s) printed for medical record Attempts: 1 Following insertion, dressing applied, line sutured and Biopatch. Post procedure assessment: blood return through all ports  Patient tolerated the procedure well with no immediate complications. Additional procedure comments: Attempted L. IJ insertion without success. R.IJ central line inserted without difficulty .

## 2017-07-30 LAB — BASIC METABOLIC PANEL
Anion gap: 11 (ref 5–15)
BUN: 18 mg/dL (ref 6–20)
CO2: 26 mmol/L (ref 22–32)
CREATININE: 0.82 mg/dL (ref 0.44–1.00)
Calcium: 8.6 mg/dL — ABNORMAL LOW (ref 8.9–10.3)
Chloride: 101 mmol/L (ref 101–111)
GFR calc non Af Amer: >60 mL/min (ref >60)
Glucose, Bld: 157 mg/dL — ABNORMAL HIGH (ref 65–99)
POTASSIUM: 3.2 mmol/L — AB (ref 3.5–5.1)
SODIUM: 138 mmol/L (ref 135–145)

## 2017-07-30 LAB — CBC
HEMATOCRIT: 32.1 % — AB (ref 36.0–46.0)
HEMOGLOBIN: 10.9 g/dL — AB (ref 12.0–15.0)
MCH: 31.5 pg (ref 26.0–34.0)
MCHC: 34 g/dL (ref 30.0–36.0)
MCV: 92.8 fL (ref 78.0–100.0)
Platelets: 244 10*3/uL (ref 150–400)
RBC: 3.46 MIL/uL — AB (ref 3.87–5.11)
RDW: 12.4 % (ref 11.5–15.5)
WBC: 14.9 10*3/uL — ABNORMAL HIGH (ref 4.0–10.5)

## 2017-07-30 MED ORDER — CYCLOBENZAPRINE HCL 10 MG PO TABS: 10.0000 mg | ORAL_TABLET | Freq: Three times a day (TID) | ORAL | 1 refills | Status: DC | PRN

## 2017-07-30 MED ORDER — OXYCODONE HCL 10 MG PO TABS: 10.0000 mg | ORAL_TABLET | ORAL | 0 refills | Status: DC | PRN

## 2017-07-30 MED ORDER — DOCUSATE SODIUM 100 MG PO CAPS: 100.0000 mg | ORAL_CAPSULE | Freq: Two times a day (BID) | ORAL | 0 refills | Status: DC

## 2017-07-30 NOTE — Evaluation (Signed)
Occupational Therapy Evaluation Patient Details Name: Kimberly Buchanan MRN: [id number] DOB: December 03, 1975 Today's Date: 07/30/2017    History of Present Illness Pt is a 42 y.o. female s/p L4-5 fusion. PMH is significant for IV drug abuse, hand swelling.   Clinical Impression   This 42 y/o F presents with the above. At baseline pt is independent with ADLs and functional mobility. Pt completing room level functional mobility with supervision this session; currently requires MinGuard-MinA for LB ADLs secondary to adhering to back precautions. Education provided on safety, AE and compensatory techniques for completing ADLs and functional transfers while adhering to precautions with Pt verbalizing understanding. Pt reports will be returning home with spouse/family assist for ADLs PRN. Questions answered throughout. No further acute OT needs identified at this time. Will sign off.     Follow Up Recommendations  No OT follow up;Supervision - Intermittent    Equipment Recommendations  None recommended by OT           Precautions / Restrictions Precautions Precautions: Back Precaution Booklet Issued: Yes (comment) Precaution Comments: Patient issued a precaution booklet and able to recall them during treatment.  Required Braces or Orthoses: Spinal Brace Spinal Brace: Lumbar corset;Applied in sitting position Restrictions Weight Bearing Restrictions: No      Mobility Bed Mobility               General bed mobility comments: OOB with PT   Transfers Overall transfer level: Modified independent Equipment used: None                  Balance Overall balance assessment: Modified Independent                                         ADL either performed or assessed with clinical judgement   ADL Overall ADL's : Needs assistance/impaired Eating/Feeding: Modified independent;Sitting   Grooming: Supervision/safety;Standing   Upper Body Bathing: Supervision/  safety;Standing   Lower Body Bathing: Sit to/from stand;Min guard   Upper Body Dressing : Min guard;Sitting   Lower Body Dressing: Min guard;Sit to/from stand Lower Body Dressing Details (indicate cue type and reason): pt able to complete figure 4 technique without difficulty sitting EOB Toilet Transfer: Supervision/safety;Ambulation;Regular Social worker and Hygiene: Min guard;Sit to/from Nurse, children's Details (indicate cue type and reason): educated on safe tub transfer technique; educated on use of shower chair and/or AE for increased safety during task completion as well as adherence to back precautions with pt verbalizing understanding  Functional mobility during ADLs: Supervision/safety General ADL Comments: education provided on back precautions, safety and compensatory techniques for completing ADLs while adhering to back precautions with pt verbalizing understanding; pt dressed at start of session                          Pertinent Vitals/Pain Pain Assessment: Faces Faces Pain Scale: Hurts little more Pain Location: Back  Pain Descriptors / Indicators: Aching;Operative site guarding Pain Intervention(s): Monitored during session          Extremity/Trunk Assessment Upper Extremity Assessment Upper Extremity Assessment: Overall WFL for tasks assessed   Lower Extremity Assessment Lower Extremity Assessment: Defer to PT evaluation   Cervical / Trunk Assessment Cervical / Trunk Assessment: Normal;Other exceptions Cervical / Trunk Exceptions: s/p spinal surgery    Communication Communication Communication: No  difficulties   Cognition Arousal/Alertness: Awake/alert Behavior During Therapy: WFL for tasks assessed/performed Overall Cognitive Status: Within Functional Limits for tasks assessed                                                      Home Living Family/patient expects to be discharged  to:: Private residence Living Arrangements: Spouse/significant other;Children Available Help at Discharge: Family Type of Home: House Home Access: Level entry     Home Layout: Two level Alternate Level Stairs-Number of Steps: 6 Alternate Level Stairs-Rails: Can reach both Bathroom Shower/Tub: Producer, television/film/videoWalk-in shower   Bathroom Toilet: Standard Bathroom Accessibility: Yes   Home Equipment: None   Additional Comments: Patient states there are a few stairs to reach 2nd level which she rarely goes up. Bedroom, bathroom, and kitchen are all accessible on the 1st floor.       Prior Functioning/Environment Level of Independence: Independent                 OT Problem List: Decreased knowledge of precautions            OT Goals(Current goals can be found in the care plan section) Acute Rehab OT Goals Patient Stated Goal: Home this morning OT Goal Formulation: All assessment and education complete, DC therapy                                 AM-PAC PT "6 Clicks" Daily Activity     Outcome Measure Help from another person eating meals?: None Help from another person taking care of personal grooming?: None Help from another person toileting, which includes using toliet, bedpan, or urinal?: None Help from another person bathing (including washing, rinsing, drying)?: A Little Help from another person to put on and taking off regular upper body clothing?: None Help from another person to put on and taking off regular lower body clothing?: A Little 6 Click Score: 22   End of Session Equipment Utilized During Treatment: Back brace Nurse Communication: Mobility status  Activity Tolerance: Patient tolerated treatment well Patient left: with family/visitor present;Other (comment)(sitting EOB )  OT Visit Diagnosis: Other abnormalities of gait and mobility (R26.89)                Time: [id number] OT Time Calculation (min): 10 min Charges:  OT General Charges $OT Visit: 1  Visit OT Evaluation $OT Eval Low Complexity: 1 Low G-Codes:     Marcy SirenBreanna Yurem Viner, OT Pager [phone] 07/30/2017   [name] 07/30/2017, 2:17 PM

## 2017-07-30 NOTE — Discharge Summary (Signed)
Physician Discharge Summary  Patient ID: Kimberly Buchanan MRN: [id number] DOB/AGE: 1976/01/14 42 y.o.  Admit date: 07/29/2017 Discharge date: 07/30/2017  Admission Diagnoses: L4-5 spondylolisthesis, facet arthropathy, spinal stenosis, lumbago, lumbar radiculopathy, neurogenic claudication  Discharge Diagnoses: The same Active Problems:   Spondylolisthesis of lumbar region   Discharged Condition: good  Hospital Course: I performed an L4-5 decompression, instrumentation and fusion on the patient on 07/29/2017.  The surgery went well.  The patient's postoperative course was unremarkable.  On postoperative day #1 she requested discharge home.  The patient, and her husband, were given written and oral discharge instructions.  All their questions were answered.  We discussed the patient's postoperative pain management regards to her opioid addiction.  She understands she needs to take her medications as directed and wean herself off the oxycodone as soon as possible. Consults: Physical therapy Significant Diagnostic Studies: None Treatments: L4-5 decompression, instrumentation and fusion. Discharge Exam: Blood pressure 103/69, pulse (!) 44, temperature 97.9 F (36.6 C), temperature source Oral, resp. rate 16, height 5\' 10"  (1.778 m), weight 71.2 kg (157 lb), SpO2 97 %. The patient is alert and pleasant.  She looks well.  Her strength is normal.  Disposition: Home  Discharge Instructions    Call MD for:  difficulty breathing, headache or visual disturbances   Complete by:  As directed    Call MD for:  extreme fatigue   Complete by:  As directed    Call MD for:  hives   Complete by:  As directed    Call MD for:  persistant dizziness or light-headedness   Complete by:  As directed    Call MD for:  persistant nausea and vomiting   Complete by:  As directed    Call MD for:  redness, tenderness, or signs of infection (pain, swelling, redness, odor or green/yellow discharge around incision  site)   Complete by:  As directed    Call MD for:  severe uncontrolled pain   Complete by:  As directed    Call MD for:  temperature >100.4   Complete by:  As directed    Diet - low sodium heart healthy   Complete by:  As directed    Discharge instructions   Complete by:  As directed    Call [phone] for a followup appointment. Take a stool softener while you are using pain medications.   Driving Restrictions   Complete by:  As directed    Do not drive for 2 weeks.   Increase activity slowly   Complete by:  As directed    Lifting restrictions   Complete by:  As directed    Do not lift more than 5 pounds. No excessive bending or twisting.   May shower / Bathe   Complete by:  As directed    Remove the dressing for 3 days after surgery.  You may shower, but leave the incision alone.   Remove dressing in 48 hours   Complete by:  As directed    Your stitches are under the scan and will dissolve by themselves. The Steri-Strips will fall off after you take a few showers. Do not rub back or pick at the wound, Leave the wound alone.     Allergies as of 07/30/2017      Reactions   Sulfa Antibiotics Anaphylaxis, Hives      Medication List    STOP taking these medications   ibuprofen 200 MG tablet Commonly known as:  ADVIL,MOTRIN   nitrofurantoin (  macrocrystal-monohydrate) 100 MG capsule Commonly known as:  MACROBID     TAKE these medications   b complex vitamins tablet Take 1 tablet daily by mouth.   Biotin 1000 MCG tablet Take 1,000 mcg by mouth daily.   buprenorphine-naloxone 8-2 mg Subl SL tablet Commonly known as:  SUBOXONE Place 1 tablet under the tongue 2 (two) times daily.   CALCIUM-MAGNESIUM PO Take 1 tablet by mouth at bedtime.   chlorthalidone 25 MG tablet Commonly known as:  HYGROTON Take 1 tablet (25 mg total) by mouth daily. MUST SCHEDULE ANNUAL PHYSICAL   cholecalciferol 1000 units tablet Commonly known as:  VITAMIN D Take 1,000 Units daily by  mouth.   cyclobenzaprine 10 MG tablet Commonly known as:  FLEXERIL Take 1 tablet (10 mg total) by mouth 3 (three) times daily as needed for muscle spasms.   docusate sodium 100 MG capsule Commonly known as:  COLACE Take 1 capsule (100 mg total) by mouth 2 (two) times daily.   Magnesium 500 MG Tabs Take 1,000 mg by mouth daily.   Oxycodone HCl 10 MG Tabs Take 1 tablet (10 mg total) by mouth every 4 (four) hours as needed for severe pain ((score 7 to 10)).   valACYclovir 1000 MG tablet Commonly known as:  VALTREX Take 1,000 mg by mouth daily as needed (cold sores).        Signed: Cristi Loron 07/30/2017, 8:02 AM

## 2017-07-30 NOTE — Evaluation (Signed)
Physical Therapy Evaluation and Discharge Patient Details Name: Kimberly Buchanan MRN: [id number] DOB: 05/06/1976 Today's Date: 07/30/2017   History of Present Illness  Pt is a 42 y.o. female s/p L4-5 fusion. PMH is significant for IV drug abuse, hand swelling.  Clinical Impression  Patient evaluated by Physical Therapy with no further acute PT needs identified. All education has been completed and the patient has no further questions. At the time of PT eval pt was able to perform transfers and ambulation with gross modified independence and light guard on stairs for safety. Pt was educated on precautions, brace application/wearing schedule, and safety with activity progression. See below for any follow-up Physical Therapy or equipment needs. PT is signing off. Thank you for this referral.     Follow Up Recommendations No PT follow up;Supervision - Intermittent    Equipment Recommendations  None recommended by PT    Recommendations for Other Services       Precautions / Restrictions Precautions Precautions: Back Precaution Booklet Issued: Yes (comment) Precaution Comments: Patient issued a precaution booklet and able to recall them during treatment.  Required Braces or Orthoses: Spinal Brace Spinal Brace: Lumbar corset;Applied in sitting position Restrictions Weight Bearing Restrictions: No      Mobility  Bed Mobility Overal bed mobility: Modified Independent             General bed mobility comments: Pt was able to transition sit<>supine without cues. Maintained precautions well.   Transfers Overall transfer level: Modified independent Equipment used: None             General transfer comment: Patient able to boost up with UE from the bed while maintaining back precautions. Good trunk control was evident with upright posture and no cueing needed.  Ambulation/Gait Ambulation/Gait assistance: Modified independent (Device/Increase time) Ambulation Distance (Feet):  300 Feet Assistive device: None Gait Pattern/deviations: WFL(Within Functional Limits);Step-through pattern Gait velocity: WFL Gait velocity interpretation: at or above normal speed for age/gender General Gait Details: VC's for improved posture. Pt was able to ambulate well with casual step-through gait pattern and no unsteadiness or LOB noted.   Stairs Stairs: Yes Stairs assistance: Min guard Stair Management: One rail Right;One rail Left Number of Stairs: 10 General stair comments: Patient educated on stair navigation and was able to complete with light min guard assist for safety.   Wheelchair Mobility    Modified Rankin (Stroke Patients Only)       Balance Overall balance assessment: Modified Independent                                           Pertinent Vitals/Pain Pain Assessment: 0-10 Pain Score: 6  Pain Location: Back  Pain Descriptors / Indicators: Aching;Operative site guarding Pain Intervention(s): Monitored during session    Home Living Family/patient expects to be discharged to:: Private residence Living Arrangements: Spouse/significant other;Children Available Help at Discharge: Family Type of Home: House Home Access: Level entry     Home Layout: Two level Home Equipment: None Additional Comments: Patient states there are a few stairs to reach 2nd level which she rarely goes up. Bedroom, bathroom, and kitchen are all accessible on the 1st floor.     Prior Function Level of Independence: Independent               Hand Dominance        Extremity/Trunk Assessment   Upper  Extremity Assessment Upper Extremity Assessment: Defer to OT evaluation    Lower Extremity Assessment Lower Extremity Assessment: Overall WFL for tasks assessed    Cervical / Trunk Assessment Cervical / Trunk Assessment: Normal(s/p surgery)  Communication   Communication: No difficulties  Cognition Arousal/Alertness: Awake/alert Behavior During  Therapy: WFL for tasks assessed/performed Overall Cognitive Status: Within Functional Limits for tasks assessed                                        General Comments      Exercises     Assessment/Plan    PT Assessment Patent does not need any further PT services  PT Problem List         PT Treatment Interventions      PT Goals (Current goals can be found in the Care Plan section)  Acute Rehab PT Goals Patient Stated Goal: Home this morning PT Goal Formulation: All assessment and education complete, DC therapy    Frequency     Barriers to discharge        Co-evaluation               AM-PAC PT "6 Clicks" Daily Activity  Outcome Measure Difficulty turning over in bed (including adjusting bedclothes, sheets and blankets)?: None Difficulty moving from lying on back to sitting on the side of the bed? : None Difficulty sitting down on and standing up from a chair with arms (e.g., wheelchair, bedside commode, etc,.)?: None Help needed moving to and from a bed to chair (including a wheelchair)?: None Help needed walking in hospital room?: None Help needed climbing 3-5 steps with a railing? : A Little 6 Click Score: 23    End of Session Equipment Utilized During Treatment: Gait belt;Back brace Activity Tolerance: Patient tolerated treatment well;No increased pain Patient left: in bed;with call bell/phone within reach Nurse Communication: Mobility status PT Visit Diagnosis: Unsteadiness on feet (R26.81);Pain Pain - part of body: (back)    Time: 1610-9604 PT Time Calculation (min) (ACUTE ONLY): 14 min   Charges:   PT Evaluation $PT Eval Low Complexity: 1 Low     PT G Codes:        Conni Slipper, PT, DPT Acute Rehabilitation Services Pager: [phone]   Marylynn Pearson 07/30/2017, 11:02 AM

## 2017-07-30 NOTE — Progress Notes (Signed)
Patient is discharged from room 3C08 at this time. Alert and in stable condition. IV site d/c'[d and instructions read to patient and spouse with understanding verbalized. Left unit via wheelchair with all belongings at side. 

## 2017-08-05 ENCOUNTER — Other Ambulatory Visit: Payer: Self-pay | Admitting: Internal Medicine

## 2017-08-20 MED FILL — Heparin Sodium (Porcine) Inj 1000 Unit/ML: INTRAMUSCULAR | Qty: 30 | Status: AC

## 2017-08-20 MED FILL — Sodium Chloride IV Soln 0.9%: INTRAVENOUS | Qty: 1000 | Status: AC

## 2017-08-28 ENCOUNTER — Encounter: Payer: Self-pay | Admitting: Sports Medicine

## 2017-09-11 ENCOUNTER — Other Ambulatory Visit: Payer: Self-pay | Admitting: Internal Medicine

## 2017-09-11 NOTE — Telephone Encounter (Signed)
Last filled 08/06/17 with 1 refill... Pt must schedule CPE before any further refills can be given

## 2017-10-04 ENCOUNTER — Ambulatory Visit: Payer: BLUE CROSS/BLUE SHIELD | Admitting: Podiatry

## 2017-10-04 ENCOUNTER — Ambulatory Visit (INDEPENDENT_AMBULATORY_CARE_PROVIDER_SITE_OTHER): Payer: BLUE CROSS/BLUE SHIELD

## 2017-10-04 ENCOUNTER — Encounter: Payer: Self-pay | Admitting: Podiatry

## 2017-10-04 DIAGNOSIS — M21619 Bunion of unspecified foot: Secondary | ICD-10-CM | POA: Diagnosis not present

## 2017-10-04 DIAGNOSIS — M722 Plantar fascial fibromatosis: Secondary | ICD-10-CM

## 2017-10-04 MED ORDER — MELOXICAM 15 MG PO TABS: 15.0000 mg | ORAL_TABLET | Freq: Every day | ORAL | 1 refills | Status: AC

## 2017-10-07 NOTE — Progress Notes (Signed)
   Subjective: 42 year old female presenting today as a new patient with a chief complaint of bilateral bunions and bilateral heel pain that has been present for several years. Walking increases this pain. She has not done anything for treatment. She reports h/o back surgery on 07/28/17. Patient is here for further evaluation and treatment.   Past Medical History:  Diagnosis Date  . Broken jaw (HCC)   . Family history of adverse reaction to anesthesia    mom has PONV  . Hand swelling   . IV drug abuse (HCC)    history of 6 years clean      Objective: Physical Exam General: The patient is alert and oriented x3 in no acute distress.  Dermatology: Skin is cool, dry and supple bilateral lower extremities. Negative for open lesions or macerations.  Vascular: Palpable pedal pulses bilaterally. No edema or erythema noted. Capillary refill within normal limits.  Neurological: Epicritic and protective threshold grossly intact bilaterally.   Musculoskeletal Exam: Clinical evidence of bunion deformity noted to the respective foot. There is a moderate pain on palpation range of motion of the first MPJ. Lateral deviation of the hallux noted consistent with hallux abductovalgus. Tenderness to palpation to the plantar aspect of the right heel along the plantar fascia.  Radiographic Exam: Increased intermetatarsal angle greater than 15 with a hallux abductus angle greater than 30 noted on AP view. Moderate degenerative changes noted within the first MPJ.  Assessment: 1. HAV w/ bunion deformity bilateral 2. Plantar fasciitis right     Plan of Care:  1. Patient was evaluated. X-Rays reviewed. 2. Prescription for Meloxicam provided to patient.  3. Recommended OTC insoles from [employer].  4. Return to clinic as needed.   Goes by Kimberly Buchanan.   Felecia Shelling, DPM [facility]  Dr. Felecia Shelling, DPM    [address]                                          [place], [place] [address]                Office [phone]  Fax [phone]

## 2017-11-18 ENCOUNTER — Ambulatory Visit (INDEPENDENT_AMBULATORY_CARE_PROVIDER_SITE_OTHER): Payer: BLUE CROSS/BLUE SHIELD | Admitting: Internal Medicine

## 2017-11-18 ENCOUNTER — Encounter: Payer: Self-pay | Admitting: Internal Medicine

## 2017-11-18 ENCOUNTER — Other Ambulatory Visit: Payer: Self-pay | Admitting: Internal Medicine

## 2017-11-18 VITALS — BP 106/68 | HR 54 | Temp 98.5°F | Ht 69.5 in | Wt 169.0 lb

## 2017-11-18 DIAGNOSIS — M5136 Other intervertebral disc degeneration, lumbar region: Secondary | ICD-10-CM | POA: Diagnosis not present

## 2017-11-18 DIAGNOSIS — Z87898 Personal history of other specified conditions: Secondary | ICD-10-CM | POA: Diagnosis not present

## 2017-11-18 DIAGNOSIS — Z0000 Encounter for general adult medical examination without abnormal findings: Secondary | ICD-10-CM | POA: Diagnosis not present

## 2017-11-18 DIAGNOSIS — Z23 Encounter for immunization: Secondary | ICD-10-CM | POA: Diagnosis not present

## 2017-11-18 DIAGNOSIS — F1111 Opioid abuse, in remission: Secondary | ICD-10-CM

## 2017-11-18 DIAGNOSIS — F325 Major depressive disorder, single episode, in full remission: Secondary | ICD-10-CM | POA: Diagnosis not present

## 2017-11-18 DIAGNOSIS — M7989 Other specified soft tissue disorders: Secondary | ICD-10-CM

## 2017-11-18 NOTE — Addendum Note (Signed)
Addended by: Wendi MayaGREESON, Shakur Lembo on: 11/18/2017 04:29 PM   Modules accepted: Orders

## 2017-11-18 NOTE — Assessment & Plan Note (Signed)
Controlled off meds  Will monitor 

## 2017-11-18 NOTE — Assessment & Plan Note (Signed)
Continue Chlorthalidone CMET, ESR, CRP and ANA today

## 2017-11-18 NOTE — Assessment & Plan Note (Signed)
S/p fusion Continue Flexeril prn Encouraged regular activity

## 2017-11-18 NOTE — Assessment & Plan Note (Signed)
Continue Suboxone 

## 2017-11-18 NOTE — Progress Notes (Signed)
Subjective:    Patient ID: Kimberly Buchanan, female    DOB: January 08, 1976, 42 y.o.   MRN: [id number]  HPI  Pt presents to the clinic today for her annual exam. She is also due to follow up chronic conditions.  Chronic Low Back Pain: s/p L4-5 lumbar fusion. She is following with Dr. Lovell Sheehan. He has her on Flexeril.  Depression: Currently not an issue off meds.  Chronic Hand Swelling: She takes Chlorthalidone daily, but she is not sure if it helps. She is concerned about autoimmune issues.  History of Heroin Abuse: Controlled on Suboxone.  Flu: 03/2017 Tetanus: > 10 years ago Pap Smear: 2016, [facility] Mammogram: never Vision Screening: as needed Dentist: biannually  Diet: She does eat meat. She consumes fruits and veggies daily. She does eat some fried foods. She drinks mostly water. Exercise: Running 20 miles weekly.   Review of Systems      Past Medical History:  Diagnosis Date  . Broken jaw (HCC)   . Family history of adverse reaction to anesthesia    mom has PONV  . Hand swelling   . IV drug abuse (HCC)    history of 6 years clean    Current Outpatient Medications  Medication Sig Dispense Refill  . b complex vitamins tablet Take 1 tablet daily by mouth.    . Biotin 1000 MCG tablet Take 1,000 mcg by mouth daily.    . buprenorphine-naloxone (SUBOXONE) 8-2 mg SUBL SL tablet Place 1 tablet under the tongue 2 (two) times daily.    Marland Kitchen CALCIUM-MAGNESIUM PO Take 1 tablet by mouth at bedtime.    . chlorthalidone (HYGROTON) 25 MG tablet TAKE 1 TABLET BY MOUTH ONCE DAILY *MUST SCHEDULE ANNUAL PHYSICAL* 30 tablet 0  . cholecalciferol (VITAMIN D) 1000 units tablet Take 1,000 Units daily by mouth.    . cyclobenzaprine (FLEXERIL) 10 MG tablet Take 1 tablet (10 mg total) by mouth 3 (three) times daily as needed for muscle spasms. 50 tablet 1  . docusate sodium (COLACE) 100 MG capsule Take 1 capsule (100 mg total) by mouth 2 (two) times daily. (Patient not taking: Reported on  10/04/2017) 60 capsule 0  . Magnesium 500 MG TABS Take 1,000 mg by mouth daily.     Marland Kitchen oxyCODONE 10 MG TABS Take 1 tablet (10 mg total) by mouth every 4 (four) hours as needed for severe pain ((score 7 to 10)). (Patient not taking: Reported on 10/04/2017) 30 tablet 0  . valACYclovir (VALTREX) 1000 MG tablet Take 1,000 mg by mouth daily as needed (cold sores).   1   No current facility-administered medications for this visit.     Allergies  Allergen Reactions  . Sulfa Antibiotics Anaphylaxis and Hives    Family History  Problem Relation Age of Onset  . Hypertension Mother   . Arthritis Maternal Grandmother   . Cancer Maternal Grandmother        lung  . Hypertension Maternal Grandfather     Social History   Socioeconomic History  . Marital status: Married    Spouse name: Not on file  . Number of children: Not on file  . Years of education: Not on file  . Highest education level: Not on file  Occupational History  . Not on file  Social Needs  . Financial resource strain: Not on file  . Food insecurity:    Worry: Not on file    Inability: Not on file  . Transportation needs:    Medical:  Not on file    Non-medical: Not on file  Tobacco Use  . Smoking status: Former Smoker    Last attempt to quit: 07/25/2015    Years since quitting: 2.3  . Smokeless tobacco: Never Used  Substance and Sexual Activity  . Alcohol use: No  . Drug use: No    Types: Heroin    Comment: histroy  clean for 6 years  . Sexual activity: Never  Lifestyle  . Physical activity:    Days per week: Not on file    Minutes per session: Not on file  . Stress: Not on file  Relationships  . Social connections:    Talks on phone: Not on file    Gets together: Not on file    Attends religious service: Not on file    Active member of club or organization: Not on file    Attends meetings of clubs or organizations: Not on file    Relationship status: Not on file  . Intimate partner violence:    Fear of  current or ex partner: Not on file    Emotionally abused: Not on file    Physically abused: Not on file    Forced sexual activity: Not on file  Other Topics Concern  . Not on file  Social History Narrative  . Not on file     Constitutional: Denies fever, malaise, fatigue, headache or abrupt weight changes.  HEENT: Denies eye pain, eye redness, ear pain, ringing in the ears, wax buildup, runny nose, nasal congestion, bloody nose, or sore throat. Respiratory: Denies difficulty breathing, shortness of breath, cough or sputum production.   Cardiovascular: Denies chest pain, chest tightness, palpitations or swelling in the hands or feet.  Gastrointestinal: Denies abdominal pain, bloating, constipation, diarrhea or blood in the stool.  GU: Denies urgency, frequency, pain with urination, burning sensation, blood in urine, odor or discharge. Musculoskeletal: Denies decrease in range of motion, difficulty with gait, muscle pain or joint pain and swelling.  Skin: Denies redness, rashes, lesions or ulcercations.  Neurological: Denies dizziness, difficulty with memory, difficulty with speech or problems with balance and coordination.  Psych: Denies anxiety, depression, SI/HI.  No other specific complaints in a complete review of systems (except as listed in HPI above).  Objective:   Physical Exam  BP 106/68   Pulse (!) 54   Temp 98.5 F (36.9 C) (Oral)   Ht 5' 9.5" (1.765 m)   Wt 169 lb (76.7 kg)   SpO2 98%   BMI 24.60 kg/m  Wt Readings from Last 3 Encounters:  11/18/17 169 lb (76.7 kg)  07/29/17 157 lb (71.2 kg)  07/24/17 157 lb (71.2 kg)    General: Appears her stated age, well developed, well nourished in NAD. Skin: Warm, dry and intact.  HEENT: Head: normal shape and size; Eyes: sclera white, no icterus, conjunctiva pink, PERRLA and EOMs intact; Ears: Tm's gray and intact, normal light reflex; Throat/Mouth: Teeth present, mucosa pink and moist, no exudate, lesions or ulcerations  noted.  Neck:  Neck supple, trachea midline. No masses, lumps or thyromegaly present.  Cardiovascular: Normal rate and rhythm. S1,S2 noted.  No murmur, rubs or gallops noted. No JVD or BLE edema.  Pulmonary/Chest: Normal effort and positive vesicular breath sounds. No respiratory distress. No wheezes, rales or ronchi noted.  Abdomen: Soft and nontender. Normal bowel sounds. No distention or masses noted. Liver, spleen and kidneys non palpable. Musculoskeletal: Strength 5/5 BUE/BLE. No difficulty with gait.  Neurological: Alert and oriented.  Cranial nerves II-XII grossly intact. Coordination normal.  Psychiatric: Mood and affect normal. Behavior is normal. Judgment and thought content normal.    BMET    Component Value Date/Time   NA 138 07/30/2017 0530   K 3.2 (L) 07/30/2017 0530   CL 101 07/30/2017 0530   CO2 26 07/30/2017 0530   GLUCOSE 157 (H) 07/30/2017 0530   BUN 18 07/30/2017 0530   CREATININE 0.82 07/30/2017 0530   CALCIUM 8.6 (L) 07/30/2017 0530   GFRNONAA >60 07/30/2017 0530   GFRAA >60 07/30/2017 0530    Lipid Panel  No results found for: CHOL, TRIG, HDL, CHOLHDL, VLDL, LDLCALC  CBC    Component Value Date/Time   WBC 14.9 (H) 07/30/2017 0530   RBC 3.46 (L) 07/30/2017 0530   HGB 10.9 (L) 07/30/2017 0530   HCT 32.1 (L) 07/30/2017 0530   PLT 244 07/30/2017 0530   MCV 92.8 07/30/2017 0530   MCH 31.5 07/30/2017 0530   MCHC 34.0 07/30/2017 0530   RDW 12.4 07/30/2017 0530    Hgb A1C No results found for: HGBA1C          Assessment & Plan:   Preventative Health Maintenance:  Encouraged her to get a flu shot in the fall Tetanus booster today She declines pap smear today Will start screening mammograms at age 24 Encouraged her to consume a balanced diet and exercise regimen Advised her to see an eye doctor and dentist annually Will check CBC, CMET, Lipid and Vit D today  RTC in 1 year, sooner if needed Nicki Reaper, NP

## 2017-11-18 NOTE — Patient Instructions (Signed)

## 2017-11-20 NOTE — Addendum Note (Signed)
Addended by: Roena MaladyEVONTENNO, Maeby Vankleeck Y on: 11/20/2017 11:18 AM   Modules accepted: Orders

## 2018-03-27 ENCOUNTER — Ambulatory Visit: Payer: BLUE CROSS/BLUE SHIELD | Admitting: Internal Medicine

## 2018-03-27 ENCOUNTER — Encounter: Payer: Self-pay | Admitting: Internal Medicine

## 2018-03-27 VITALS — BP 106/68 | HR 50 | Temp 98.3°F | Wt 161.0 lb

## 2018-03-27 DIAGNOSIS — R300 Dysuria: Secondary | ICD-10-CM | POA: Diagnosis not present

## 2018-03-27 LAB — POC URINALSYSI DIPSTICK (AUTOMATED)
Bilirubin, UA: NEGATIVE
Blood, UA: NEGATIVE
Glucose, UA: NEGATIVE
KETONES UA: NEGATIVE
LEUKOCYTES UA: NEGATIVE
Nitrite, UA: POSITIVE
Protein, UA: NEGATIVE
Spec Grav, UA: 1.02 (ref 1.010–1.025)
UROBILINOGEN UA: 1 U/dL
pH, UA: 6 (ref 5.0–8.0)

## 2018-03-27 MED ORDER — NITROFURANTOIN MONOHYD MACRO 100 MG PO CAPS: 100.0000 mg | ORAL_CAPSULE | Freq: Two times a day (BID) | ORAL | 0 refills | Status: DC

## 2018-03-27 NOTE — Progress Notes (Signed)
HPI  Pt presents to the clinic today with c/o dysuria. She reports this started 2-3 days ago. She denies urgency, frequency or blood in her urine. She denies vaginal discharge, odor, abnormal bleeding or pelvic pain. She has tried AZO with some relief.    Review of Systems  Past Medical History:  Diagnosis Date  . Broken jaw (HCC)   . Family history of adverse reaction to anesthesia    mom has PONV  . Hand swelling   . IV drug abuse (HCC)    history of 6 years clean    Family History  Problem Relation Age of Onset  . Hypertension Mother   . Arthritis Maternal Grandmother   . Cancer Maternal Grandmother        lung  . Hypertension Maternal Grandfather     Social History   Socioeconomic History  . Marital status: Married    Spouse name: Not on file  . Number of children: Not on file  . Years of education: Not on file  . Highest education level: Not on file  Occupational History  . Not on file  Social Needs  . Financial resource strain: Not on file  . Food insecurity:    Worry: Not on file    Inability: Not on file  . Transportation needs:    Medical: Not on file    Non-medical: Not on file  Tobacco Use  . Smoking status: Former Smoker    Last attempt to quit: 07/25/2015    Years since quitting: 2.6  . Smokeless tobacco: Never Used  Substance and Sexual Activity  . Alcohol use: No  . Drug use: No    Types: Heroin    Comment: histroy  clean for 6 years  . Sexual activity: Never  Lifestyle  . Physical activity:    Days per week: Not on file    Minutes per session: Not on file  . Stress: Not on file  Relationships  . Social connections:    Talks on phone: Not on file    Gets together: Not on file    Attends religious service: Not on file    Active member of club or organization: Not on file    Attends meetings of clubs or organizations: Not on file    Relationship status: Not on file  . Intimate partner violence:    Fear of current or ex partner: Not on  file    Emotionally abused: Not on file    Physically abused: Not on file    Forced sexual activity: Not on file  Other Topics Concern  . Not on file  Social History Narrative  . Not on file    Allergies  Allergen Reactions  . Sulfa Antibiotics Anaphylaxis and Hives     Constitutional: Denies fever, malaise, fatigue, headache or abrupt weight changes.   GU: Pt reports pain with urination. Denies urgency, frequency, burning sensation, blood in urine, odor or discharge. Skin: Denies redness, rashes, lesions or ulcercations.   No other specific complaints in a complete review of systems (except as listed in HPI above).    Objective:   Physical Exam  BP 106/68   Pulse (!) 50   Temp 98.3 F (36.8 C) (Oral)   Wt 161 lb (73 kg)   SpO2 97%   BMI 23.43 kg/m  Wt Readings from Last 3 Encounters:  03/27/18 161 lb (73 kg)  11/18/17 169 lb (76.7 kg)  07/29/17 157 lb (71.2 kg)    General:  Appears her stated age, well developed, well nourished in NAD. Abdomen: Soft. Normal bowel sounds. No distention or masses noted.  Tender to palpation over the bladder area. No CVA tenderness.        Assessment & Plan:   Dysuria   Urinalysis: AZO interferred Will send urine culture eRx sent if for Macrobid 100 mg BID x 5 days OK to take AZO OTC Drink plenty of fluids  RTC as needed or if symptoms persist. Nicki Reaper, NP

## 2018-03-27 NOTE — Patient Instructions (Signed)

## 2018-03-28 LAB — URINE CULTURE
MICRO NUMBER:: 91312133
SPECIMEN QUALITY:: ADEQUATE

## 2018-03-29 ENCOUNTER — Encounter: Payer: Self-pay | Admitting: Internal Medicine

## 2019-01-07 IMAGING — DX DG LUMBAR SPINE 2-3V
3 series · 3 of 3 positions shown · non-contrast
Comparison: MRI 01/04/2010

CLINICAL DATA: 41-year-old female with a history of worsening right
lower back pain

EXAM:
LUMBAR SPINE - 2-3 VIEW

[lumbar spine ap]
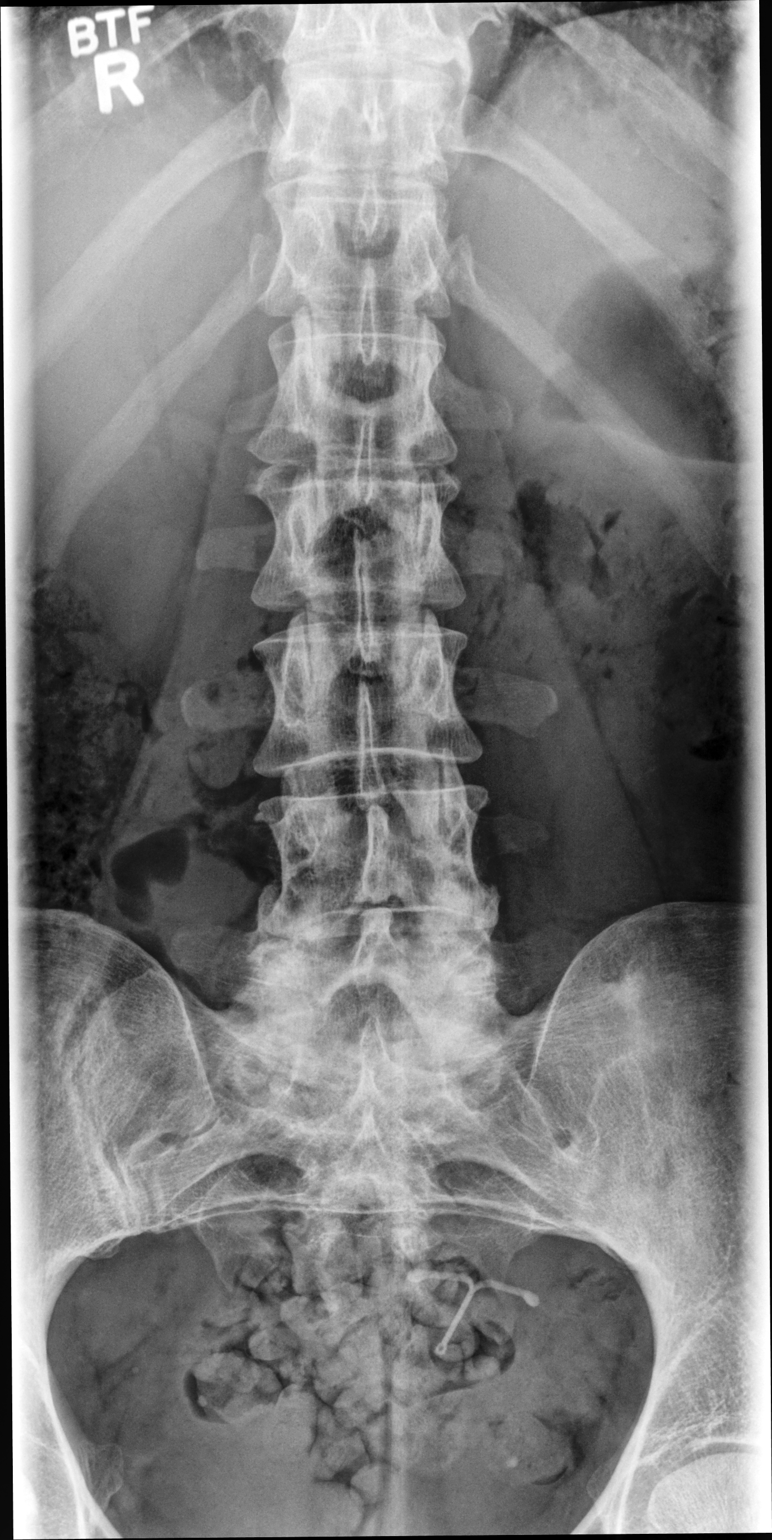

[lumbar spine lat (1 of 2)]
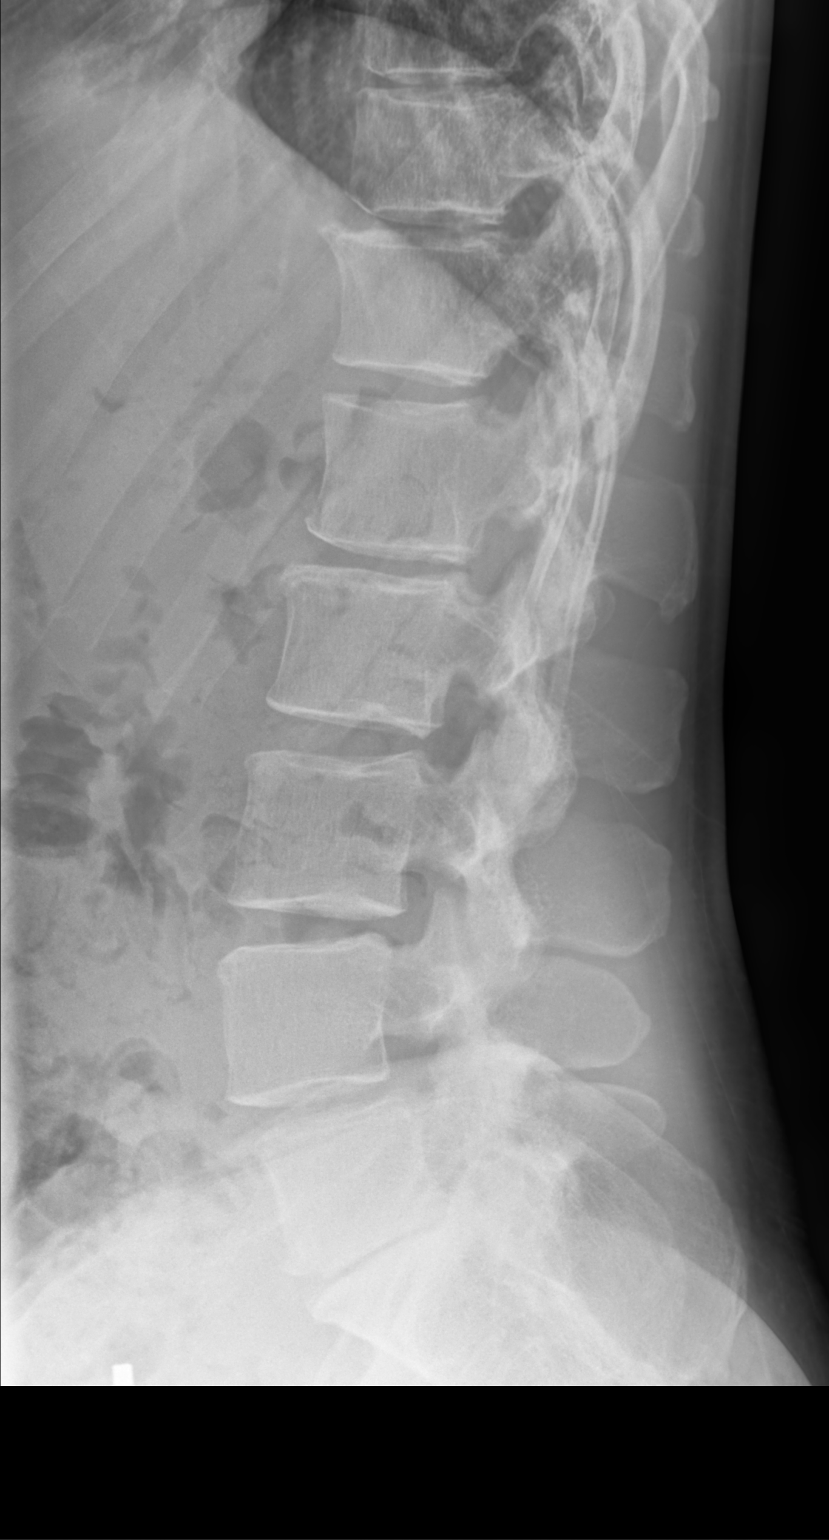

[lumbar spine lat (2 of 2)]
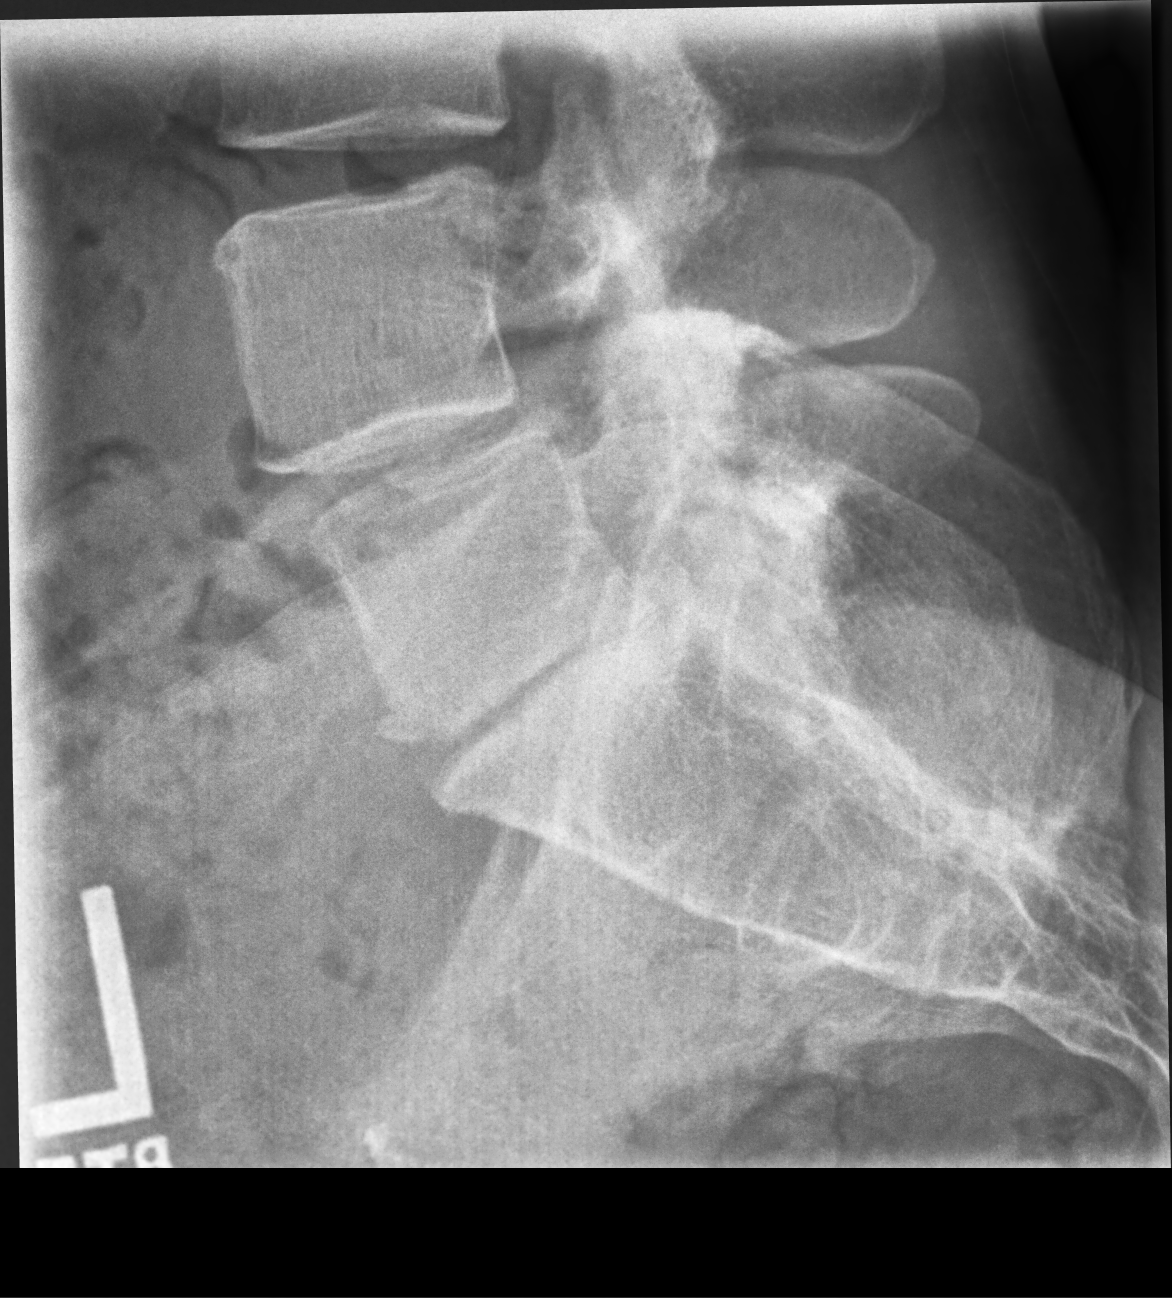

[3 of 3 positions shown; findings below may reference images not displayed]

FINDINGS: Lumbar Spine:

Lumbar vertebral elements maintain normal alignment without evidence
of subluxation.

Trace retrolisthesis of L3 on L4. Minimal anterolisthesis of L4 on
L5. These changes are new from the comparison MRI of 6177.

No fracture line identified.  Vertebral body heights maintained.

Height loss of the disc space of L5-S1 with associated facet
hypertrophy. Endplate sclerosis at L5-S1.

Unremarkable appearance of the visualized abdomen.
IMPRESSION: No acute fracture or malalignment of the lumbar spine.

Degenerative disc disease with disc space narrowing and endplate
sclerosis of L5-S1 with associated facet hypertrophy.

There is new mild retrolisthesis of L3 on L4 and new mild
anterolisthesis of L4 on L5.

## 2019-04-01 ENCOUNTER — Ambulatory Visit
Admission: EM | Admit: 2019-04-01 | Discharge: 2019-04-01 | Disposition: A | Discharge status: Home / Self Care | Payer: BLUE CROSS/BLUE SHIELD | Attending: Family Medicine | Admitting: Family Medicine

## 2019-04-01 ENCOUNTER — Ambulatory Visit (INDEPENDENT_AMBULATORY_CARE_PROVIDER_SITE_OTHER)
Admit: 2019-04-01 | Discharge: 2019-04-01 | Disposition: A | Discharge status: Home / Self Care | Payer: BLUE CROSS/BLUE SHIELD

## 2019-04-01 ENCOUNTER — Encounter: Payer: Self-pay | Admitting: Emergency Medicine

## 2019-04-01 ENCOUNTER — Other Ambulatory Visit: Payer: Self-pay

## 2019-04-01 DIAGNOSIS — L02416 Cutaneous abscess of left lower limb: Secondary | ICD-10-CM

## 2019-04-01 DIAGNOSIS — L03116 Cellulitis of left lower limb: Secondary | ICD-10-CM | POA: Diagnosis not present

## 2019-04-01 MED ORDER — DOXYCYCLINE HYCLATE 100 MG PO CAPS: 100.0000 mg | ORAL_CAPSULE | Freq: Two times a day (BID) | ORAL | 0 refills | Status: DC

## 2019-04-01 MED ORDER — CEFTRIAXONE SODIUM 1 G IJ SOLR
1.0000 g | Freq: Once | INTRAMUSCULAR | Status: AC
  Administered 2019-04-01: 1 g via INTRAMUSCULAR

## 2019-04-01 MED ORDER — MUPIROCIN 2 % EX OINT: TOPICAL_OINTMENT | CUTANEOUS | 0 refills | Status: AC

## 2019-04-01 NOTE — ED Triage Notes (Signed)
Patient c/o cutting her left leg with a razor about 1 week ago. She now has drainage, redness and swelling to her left leg.

## 2019-04-01 NOTE — Discharge Instructions (Addendum)
Take medication as prescribed. Rest. Drink plenty of fluids. Elevate. Warm compresses.  Monitor very closely.  Follow up with your primary care physician this week.  Proceed directly to the emergency room for increased swelling or pain, no improvement within 48 hours or worsening concerns.

## 2019-04-01 NOTE — ED Provider Notes (Addendum)
[facility] ____________________________________________  Time seen: Approximately 4:56 PM  I have reviewed the triage vital signs and the nursing notes.   HISTORY  Chief Complaint Wound Infection  HPI NOU CHARD is a 43 y.o. female presenting for evaluation of left leg redness and swelling. Reports one week ago she was shaving her legs and cut the area. States she has had gradual redness and swelling develop to that area, and reports in last two days it has started to drain pus, and has continued draining.  Has continued remain active and ambulatory.  States no issues with bending or straightening her knee.  Denies fevers, chest pain, shortness of breath.  Has been applying warm soaks which helps with the drainage.  Denies other aggravating alleviating factors.  Pain currently mild to moderate.  No recent sickness.  Denies history of MRSA.  Reports tetanus immunization is up-to-date and within the last 10 years.  Has continued to eat and drink well.  Reports otherwise doing well.  Jearld Fenton, NP : PCP  No LMP recorded. (Menstrual status: IUD).Denies pregnancy.    Past Medical History:  Diagnosis Date   Broken jaw ([place])    Family history of adverse reaction to anesthesia    mom has PONV   Hand swelling    IV drug abuse ([place])    history of 6 years clean    Patient Active Problem List   Diagnosis Date Noted   Degeneration of lumbar intervertebral disc 01/03/2017   Depression 02/16/2016   Bilateral hand swelling 02/16/2016   History of heroin abuse ([place]) 02/16/2016    Past Surgical History:  Procedure Laterality Date   MANDIBLE FRACTURE SURGERY       No current facility-administered medications for this encounter.   Current Outpatient Medications:    b complex vitamins tablet, Take 1 tablet daily by mouth., Disp: , Rfl:    Biotin 1000 MCG tablet, Take 1,000 mcg by mouth daily., Disp: , Rfl:    buprenorphine-naloxone (SUBOXONE) 8-2  mg SUBL SL tablet, Place 1 tablet under the tongue 2 (two) times daily., Disp: , Rfl:    CALCIUM-MAGNESIUM PO, Take 1 tablet by mouth at bedtime., Disp: , Rfl:    cholecalciferol (VITAMIN D) 1000 units tablet, Take 1,000 Units daily by mouth., Disp: , Rfl:    Magnesium 500 MG TABS, Take 1,000 mg by mouth daily. , Disp: , Rfl:    doxycycline (VIBRAMYCIN) 100 MG capsule, Take 1 capsule (100 mg total) by mouth 2 (two) times daily., Disp: 20 capsule, Rfl: 0   mupirocin ointment (BACTROBAN) 2 %, Apply three times a day for 7 days., Disp: 22 g, Rfl: 0  Allergies Sulfa antibiotics and Bactrim [sulfamethoxazole-trimethoprim]  Family History  Problem Relation Age of Onset   Hypertension Mother    Arthritis Maternal Grandmother    Cancer Maternal Grandmother        lung   Hypertension Maternal Grandfather     Social History Social History   Tobacco Use   Smoking status: Former Smoker    Quit date: 07/25/2015    Years since quitting: 3.6   Smokeless tobacco: Never Used  Substance Use Topics   Alcohol use: No   Drug use: Yes    Types: Heroin, Marijuana    Comment: today 04/01/2019    Review of Systems Constitutional: No fever ENT: No sore throat. Cardiovascular: Denies chest pain. Respiratory: Denies shortness of breath. Gastrointestinal: No abdominal pain.  no vomiting. Musculoskeletal: Negative for back pain.  Skin: Positive for rash. Neurological: Negative for focal weakness or numbness.    ____________________________________________   PHYSICAL EXAM:  VITAL SIGNS: ED Triage Vitals  Enc Vitals Group     BP 04/01/19 1629 (!) 120/94     Pulse Rate 04/01/19 1629 79     Resp -- 18     Temp 04/01/19 1629 98.8 F (37.1 C)     Temp Source 04/01/19 1629 Oral     SpO2 04/01/19 1629 100 %     Weight 04/01/19 1626 155 lb (70.3 kg)     Height 04/01/19 1626 5\' 10"  (1.778 m)     Head Circumference --      Peak Flow --      Pain Score 04/01/19 1626 6     Pain Loc  --      Pain Edu? --      Excl. in GC? --     Constitutional: Alert and oriented. Well appearing and in no acute distress. Eyes: Conjunctivae are normal.  ENT      Head: Normocephalic and atraumatic. Cardiovascular: Normal rate, regular rhythm. Grossly normal heart sounds.  Good peripheral circulation. Respiratory: Normal respiratory effort without tachypnea nor retractions. Breath sounds are clear and equal bilaterally. No wheezes, rales, rhonchi. Musculoskeletal:  Steady gait. Bilateral pedal pulses equal and easily palpated. Neurologic:  Normal speech and language. No gross focal neurologic deficits are appreciated. Speech is normal. No gait instability.  Skin:  Skin is warm, dry.  Except: Left shin as depicted below actively draining large amount of purulent serosanguineous drainage with surrounding erythema and tenderness, mild distal nonpitting edema, lower extremity with minimal tenderness to medial ankle without bony tenderness, as well as with mild to moderate tenderness directly to erythema site, lower extremity otherwise nontender, full range of motion present to knee without difficulty with range of motion's.  Left knee nontender.  Ambulatory with steady gait.  Bilateral distal posterior tibialis and dorsalis pedis pulses equal and easily palpated.       Psychiatric: Mood and affect are normal. Speech and behavior are normal. Patient exhibits appropriate insight and judgment   ___________________________________________   LABS (all labs ordered are listed, but only abnormal results are displayed)  Labs Reviewed  AEROBIC CULTURE (SUPERFICIAL SPECIMEN)   ____________________________________________  RADIOLOGY  [place] Venous Img Lower Unilateral Left  Result Date: 04/01/2019 CLINICAL DATA:  43 year old female with a history of left leg abscess EXAM: LEFT LOWER EXTREMITY VENOUS DOPPLER ULTRASOUND TECHNIQUE: Gray-scale sonography with graded compression, as well as color  Doppler and duplex ultrasound were performed to evaluate the lower extremity deep venous systems from the level of the common femoral vein and including the common femoral, femoral, profunda femoral, popliteal and calf veins including the posterior tibial, peroneal and gastrocnemius veins when visible. The superficial great saphenous vein was also interrogated. Spectral Doppler was utilized to evaluate flow at rest and with distal augmentation maneuvers in the common femoral, femoral and popliteal veins. COMPARISON:  None. FINDINGS: Contralateral Common Femoral Vein: Respiratory phasicity is normal and symmetric with the symptomatic side. No evidence of thrombus. Normal compressibility. Common Femoral Vein: No evidence of thrombus. Normal compressibility, respiratory phasicity and response to augmentation. Saphenofemoral Junction: No evidence of thrombus. Normal compressibility and flow on color Doppler imaging. Profunda Femoral Vein: No evidence of thrombus. Normal compressibility and flow on color Doppler imaging. Femoral Vein: No evidence of thrombus. Normal compressibility, respiratory phasicity and response to augmentation. Popliteal Vein: No evidence of thrombus. Normal compressibility, respiratory phasicity  and response to augmentation. Calf Veins: No evidence of thrombus. Normal compressibility and flow on color Doppler imaging. Superficial Great Saphenous Vein: No evidence of thrombus. Normal compressibility and flow on color Doppler imaging. Other Findings: Lymph nodes of the inguinal region with architecture maintained and enlarged. IMPRESSION: Sonographic survey of the left lower extremity negative for DVT Borderline enlarged lymph nodes of the left inguinal region with typical architecture maintained, likely reactive given the history. Electronically Signed   By: Gilmer Mor D.O.   On: 04/01/2019 18:16   ____________________________________________   PROCEDURES Procedures    INITIAL  IMPRESSION / ASSESSMENT AND PLAN / ED COURSE  Pertinent labs & imaging results that were available during my care of the patient were reviewed by me and considered in my medical decision making (see chart for details).  Patient overall well-appearing.  Left leg abscess with cellulitis.  Actively draining.  Wound culture obtained.  No further indication for I&D.  Patient states she does not might be seen in the hospital at this time and declines this.  1 g IM Rocephin given once in urgent care.  Ultrasound of left lower extremity venous obtain, result as above per radiologist, negative for DVT, borderline enlarged lymph nodes in left inguinal region likely reactive given to history.  Will empirically start oral doxycycline and topical Bactroban.  Discussed in long detail, if no improvement within 2 days proceed directly to the emergency room for further care and likely IV antibiotics.  Discussed sooner being seen in the ER for any fevers, increased pain or worsening concerns.Discussed indication, risks and benefits of medications with patient.   Discussed follow up with Primary care physician this week. Discussed follow up and return parameters including no resolution or any worsening concerns. Patient verbalized understanding and agreed to plan.   ____________________________________________   FINAL CLINICAL IMPRESSION(S) / ED DIAGNOSES  Final diagnoses:  Abscess of left leg  Left leg cellulitis     ED Discharge Orders         Ordered    US Venous Img Upper Uni Left (DVT)  Status:  Canceled     04/01/19 1649    doxycycline (VIBRAMYCIN) 100 MG capsule  2 times daily     04/01/19 1838    mupirocin ointment (BACTROBAN) 2 %     04/01/19 1838           Note: This dictation was prepared with Dragon dictation along with smaller phrase technology. Any transcriptional errors that result from this process are unintentional.         Renford Dills, NP 04/01/19 1913

## 2019-04-04 LAB — AEROBIC CULTURE W GRAM STAIN (SUPERFICIAL SPECIMEN): Gram Stain: NONE SEEN

## 2019-04-06 ENCOUNTER — Telehealth: Payer: Self-pay | Admitting: Emergency Medicine

## 2019-04-06 NOTE — Telephone Encounter (Signed)
Wound culture shows MRSA, treated with doxy. Pt contacted and made aware, will complete antibiotics. Pt states she is feeling better, wound is healing well. All questions answered.

## 2019-10-20 ENCOUNTER — Other Ambulatory Visit: Payer: Self-pay

## 2019-10-20 ENCOUNTER — Emergency Department: Payer: BLUE CROSS/BLUE SHIELD

## 2019-10-20 ENCOUNTER — Emergency Department
Admission: EM | Admit: 2019-10-20 | Discharge: 2019-10-21 | Disposition: A | Discharge status: Home / Self Care | Payer: BLUE CROSS/BLUE SHIELD | Attending: Emergency Medicine | Admitting: Emergency Medicine

## 2019-10-20 ENCOUNTER — Encounter: Payer: Self-pay | Admitting: Emergency Medicine

## 2019-10-20 DIAGNOSIS — Z79899 Other long term (current) drug therapy: Secondary | ICD-10-CM | POA: Insufficient documentation

## 2019-10-20 DIAGNOSIS — R4182 Altered mental status, unspecified: Secondary | ICD-10-CM | POA: Diagnosis not present

## 2019-10-20 DIAGNOSIS — R319 Hematuria, unspecified: Secondary | ICD-10-CM | POA: Insufficient documentation

## 2019-10-20 DIAGNOSIS — Y999 Unspecified external cause status: Secondary | ICD-10-CM | POA: Diagnosis not present

## 2019-10-20 DIAGNOSIS — Z87891 Personal history of nicotine dependence: Secondary | ICD-10-CM | POA: Diagnosis not present

## 2019-10-20 DIAGNOSIS — Y92410 Unspecified street and highway as the place of occurrence of the external cause: Secondary | ICD-10-CM | POA: Diagnosis not present

## 2019-10-20 DIAGNOSIS — R0789 Other chest pain: Secondary | ICD-10-CM | POA: Diagnosis not present

## 2019-10-20 DIAGNOSIS — Y9389 Activity, other specified: Secondary | ICD-10-CM | POA: Diagnosis not present

## 2019-10-20 DIAGNOSIS — Z733 Stress, not elsewhere classified: Secondary | ICD-10-CM | POA: Diagnosis not present

## 2019-10-20 DIAGNOSIS — R5383 Other fatigue: Secondary | ICD-10-CM | POA: Diagnosis not present

## 2019-10-20 DIAGNOSIS — N939 Abnormal uterine and vaginal bleeding, unspecified: Secondary | ICD-10-CM | POA: Insufficient documentation

## 2019-10-20 LAB — BASIC METABOLIC PANEL
Anion gap: 7 (ref 5–15)
BUN: 14 mg/dL (ref 6–20)
CO2: 29 mmol/L (ref 22–32)
Calcium: 8.7 mg/dL — ABNORMAL LOW (ref 8.9–10.3)
Chloride: 101 mmol/L (ref 98–111)
Creatinine, Ser: 0.66 mg/dL (ref 0.44–1.00)
GFR calc Af Amer: >60 mL/min (ref >60)
GFR calc non Af Amer: >60 mL/min (ref >60)
Glucose, Bld: 106 mg/dL — ABNORMAL HIGH (ref 70–99)
Potassium: 3.7 mmol/L (ref 3.5–5.1)
Sodium: 137 mmol/L (ref 135–145)

## 2019-10-20 LAB — CBC WITH DIFFERENTIAL/PLATELET
Abs Immature Granulocytes: 0.1 10*3/uL — ABNORMAL HIGH (ref 0.00–0.07)
Basophils Absolute: 0 10*3/uL (ref 0.0–0.1)
Basophils Relative: 0 %
Eosinophils Absolute: 0.1 10*3/uL (ref 0.0–0.5)
Eosinophils Relative: 1 %
HCT: 34.2 % — ABNORMAL LOW (ref 36.0–46.0)
Hemoglobin: 11.4 g/dL — ABNORMAL LOW (ref 12.0–15.0)
Immature Granulocytes: 1 %
Lymphocytes Relative: 10 %
Lymphs Abs: 1 10*3/uL (ref 0.7–4.0)
MCH: 29.3 pg (ref 26.0–34.0)
MCHC: 33.3 g/dL (ref 30.0–36.0)
MCV: 87.9 fL (ref 80.0–100.0)
Monocytes Absolute: 0.7 10*3/uL (ref 0.1–1.0)
Monocytes Relative: 6 %
Neutro Abs: 8.4 10*3/uL — ABNORMAL HIGH (ref 1.7–7.7)
Neutrophils Relative %: 82 %
Platelets: 237 10*3/uL (ref 150–400)
RBC: 3.89 MIL/uL (ref 3.87–5.11)
RDW: 13.1 % (ref 11.5–15.5)
WBC: 10.3 10*3/uL (ref 4.0–10.5)
nRBC: 0 % (ref 0.0–0.2)

## 2019-10-20 LAB — URINALYSIS, COMPLETE (UACMP) WITH MICROSCOPIC
Bacteria, UA: NONE SEEN
Bilirubin Urine: NEGATIVE
Glucose, UA: NEGATIVE mg/dL
Ketones, ur: NEGATIVE mg/dL
Leukocytes,Ua: NEGATIVE
Nitrite: NEGATIVE
Protein, ur: NEGATIVE mg/dL
Specific Gravity, Urine: 1.012 (ref 1.005–1.030)
pH: 7 (ref 5.0–8.0)

## 2019-10-20 LAB — URINE DRUG SCREEN, QUALITATIVE (ARMC ONLY)
Amphetamines, Ur Screen: POSITIVE — AB
Barbiturates, Ur Screen: NOT DETECTED
Benzodiazepine, Ur Scrn: NOT DETECTED
Cannabinoid 50 Ng, Ur ~~LOC~~: NOT DETECTED
Cocaine Metabolite,Ur ~~LOC~~: NOT DETECTED
MDMA (Ecstasy)Ur Screen: NOT DETECTED
Methadone Scn, Ur: NOT DETECTED
Opiate, Ur Screen: POSITIVE — AB
Phencyclidine (PCP) Ur S: NOT DETECTED
Tricyclic, Ur Screen: NOT DETECTED

## 2019-10-20 LAB — ETHANOL: Alcohol, Ethyl (B): <10 mg/dL (ref <10)

## 2019-10-20 LAB — POC URINE PREG, ED: Preg Test, Ur: NEGATIVE

## 2019-10-20 MED ORDER — SODIUM CHLORIDE 0.9% FLUSH: 10.0000 mL | INTRAVENOUS | Status: DC | PRN

## 2019-10-20 MED ORDER — SODIUM CHLORIDE 0.9% FLUSH: 10.0000 mL | Freq: Two times a day (BID) | INTRAVENOUS | Status: DC

## 2019-10-20 NOTE — Discharge Instructions (Addendum)
Please seek medical attention for any high fevers, chest pain, bloody urine, shortness of breath, change in behavior, persistent vomiting, bloody stool or any other new or concerning symptoms.
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
## 2019-10-20 NOTE — ED Notes (Signed)
IV team at bedside 

## 2020-07-14 ENCOUNTER — Other Ambulatory Visit: Payer: Self-pay

## 2020-07-14 ENCOUNTER — Ambulatory Visit (INDEPENDENT_AMBULATORY_CARE_PROVIDER_SITE_OTHER): Payer: BLUE CROSS/BLUE SHIELD

## 2020-07-14 ENCOUNTER — Ambulatory Visit
Admission: RE | Admit: 2020-07-14 | Discharge: 2020-07-14 | Disposition: A | Discharge status: Home / Self Care | Payer: BLUE CROSS/BLUE SHIELD | Source: Ambulatory Visit | Attending: Family Medicine | Admitting: Family Medicine

## 2020-07-14 VITALS — BP 118/76 | HR 67 | Temp 97.9°F | Resp 18

## 2020-07-14 DIAGNOSIS — M5441 Lumbago with sciatica, right side: Secondary | ICD-10-CM

## 2020-07-14 DIAGNOSIS — S32039A Unspecified fracture of third lumbar vertebra, initial encounter for closed fracture: Secondary | ICD-10-CM

## 2020-07-14 MED ORDER — PREDNISONE 10 MG (21) PO TBPK: ORAL_TABLET | ORAL | 0 refills | Status: DC

## 2020-07-14 MED ORDER — CYCLOBENZAPRINE HCL 5 MG PO TABS: 5.0000 mg | ORAL_TABLET | Freq: Three times a day (TID) | ORAL | 0 refills | Status: DC | PRN

## 2020-07-14 MED ORDER — KETOROLAC TROMETHAMINE 30 MG/ML IJ SOLN
30.0000 mg | Freq: Once | INTRAMUSCULAR | Status: AC
  Administered 2020-07-14: 30 mg via INTRAMUSCULAR

## 2020-07-14 NOTE — ED Provider Notes (Signed)
Renaldo Fiddler    CSN: [id number] Arrival date & time: 07/14/20  1053      History   Chief Complaint Chief Complaint  Patient presents with  . Back Pain    HPI JACQUELIN KRAJEWSKI is a 45 y.o. female.   Patient is a 45 year old female presents today with lower back pain.  This is been present for the past week.  The pain is in the lower back and radiates down the right leg with some associated numbness and tingling and mild swelling to the leg.  History of fusion of L4 and L5.  Denies any urinary problems or loss of bowel or bladder function.  Denies any falls or new injuries.  History of previous IV drug use.  Has been taking anti-inflammatories without much relief.     Past Medical History:  Diagnosis Date  . Broken jaw (HCC)   . Family history of adverse reaction to anesthesia    mom has PONV  . Hand swelling   . IV drug abuse (HCC)    history of 6 years clean    Patient Active Problem List   Diagnosis Date Noted  . Degeneration of lumbar intervertebral disc 01/03/2017  . Depression 02/16/2016  . Bilateral hand swelling 02/16/2016  . History of heroin abuse (HCC) 02/16/2016    Past Surgical History:  Procedure Laterality Date  . MANDIBLE FRACTURE SURGERY      OB History   No obstetric history on file.      Home Medications    Prior to Admission medications   Medication Sig Start Date End Date Taking? Authorizing Provider  b complex vitamins tablet Take 1 tablet daily by mouth.   Yes [provider]  Biotin 1000 MCG tablet Take 1,000 mcg by mouth daily.   Yes [provider]  cyclobenzaprine (FLEXERIL) 5 MG tablet Take 1 tablet (5 mg total) by mouth 3 (three) times daily as needed for muscle spasms. 07/14/20  Yes Kisa Fujii A, NP  predniSONE (STERAPRED UNI-PAK 21 TAB) 10 MG (21) TBPK tablet 6 tabs for 1 day, then 5 tabs for 1 das, then 4 tabs for 1 day, then 3 tabs for 1 day, 2 tabs for 1 day, then 1 tab for 1 day 07/14/20  Yes  Huma Imhoff A, NP  buprenorphine-naloxone (SUBOXONE) 8-2 mg SUBL SL tablet Place 1 tablet under the tongue 2 (two) times daily.    [provider]  CALCIUM-MAGNESIUM PO Take 1 tablet by mouth at bedtime.    [provider]  cholecalciferol (VITAMIN D) 1000 units tablet Take 1,000 Units daily by mouth.    [provider]  Magnesium 500 MG TABS Take 1,000 mg by mouth daily.     [provider]  mupirocin ointment (BACTROBAN) 2 % Apply three times a day for 7 days. 04/01/19   Renford Dills, NP  chlorthalidone (HYGROTON) 25 MG tablet Take 1 tablet (25 mg total) by mouth daily. 11/19/17 04/01/19  Lorre Munroe, NP    Family History Family History  Problem Relation Age of Onset  . Hypertension Mother   . Arthritis Maternal Grandmother   . Cancer Maternal Grandmother        lung  . Hypertension Maternal Grandfather     Social History Social History   Tobacco Use  . Smoking status: Former Smoker    Quit date: 07/25/2015    Years since quitting: 4.9  . Smokeless tobacco: Never Used  Vaping Use  . Vaping  Use: Every day  . Start date: 07/24/2016  Substance Use Topics  . Alcohol use: No  . Drug use: Not Currently    Types: Heroin     Allergies   Sulfa antibiotics and Bactrim [sulfamethoxazole-trimethoprim]   Review of Systems Review of Systems   Physical Exam Triage Vital Signs ED Triage Vitals  Enc Vitals Group     BP 07/14/20 1104 118/76     Pulse Rate 07/14/20 1104 67     Resp 07/14/20 1104 18     Temp 07/14/20 1104 97.9 F (36.6 C)     Temp Source 07/14/20 1104 Oral     SpO2 07/14/20 1104 98 %     Weight --      Height --      Head Circumference --      Peak Flow --      Pain Score 07/14/20 1109 5     Pain Loc --      Pain Edu? --      Excl. in GC? --    No data found.  Updated Vital Signs BP 118/76 (BP Location: Left Arm)   Pulse 67   Temp 97.9 F (36.6 C) (Oral)   Resp 18   SpO2 98%   Visual Acuity Right Eye  Distance:   Left Eye Distance:   Bilateral Distance:    Right Eye Near:   Left Eye Near:    Bilateral Near:     Physical Exam Vitals and nursing note reviewed.  Constitutional:      General: She is not in acute distress.    Appearance: Normal appearance. She is not ill-appearing, toxic-appearing or diaphoretic.  HENT:     Head: Normocephalic.  Eyes:     Conjunctiva/sclera: Conjunctivae normal.  Pulmonary:     Effort: Pulmonary effort is normal.  Musculoskeletal:     Cervical back: Normal range of motion.     Lumbar back: Swelling, tenderness and bony tenderness present. Decreased range of motion. Positive right straight leg raise test.       Back:     Comments: Mild swelling to the right leg compared to the left.   Skin:    General: Skin is warm and dry.     Findings: No rash.  Neurological:     Mental Status: She is alert.  Psychiatric:        Mood and Affect: Mood normal.      UC Treatments / Results  Labs (all labs ordered are listed, but only abnormal results are displayed) Labs Reviewed - No data to display  EKG   Radiology DG Lumbar Spine Complete  Result Date: 07/14/2020 CLINICAL DATA:  45 year old female with a history of back pain EXAM: LUMBAR SPINE - COMPLETE 4+ VIEW COMPARISON:  08/27/2017 FINDINGS: Redemonstration of surgical changes of posterior lumbar interbody fusion with bilateral pedicle screw and rod fixation of L4-L5. Interbody spacer remains centered at the disc space. Compared to the prior plain film there is a new superior endplate fracture of L3 with 30% anterior height loss. Compared to the prior plain film there is increased height loss and wedging at the L1 level without fracture line identified. Mild disc disease spanning T12-L4. Similar appearance of advanced disc space narrowing with endplate sclerosis and vacuum disc phenomenon of L5-S1. IMPRESSION: New superior endplate fracture of L3, favored to be acute given the appearance.  Approximately 30% height loss. New mild superior endplate irregularity of L1 with increased height loss compared to the plain  film of 2019, indeterminate age. Surgical changes of L4-L5 PLIF. Redemonstration of advanced disc disease of L5-S1. Electronically Signed   By: Gilmer Mor D.O.   On: 07/14/2020 11:40    Procedures Procedures (including critical care time)  Medications Ordered in UC Medications  ketorolac (TORADOL) 30 MG/ML injection 30 mg (30 mg Intramuscular Given 07/14/20 1244)    Initial Impression / Assessment and Plan / UC Course  I have reviewed the triage vital signs and the nursing notes.  Pertinent labs & imaging results that were available during my care of the patient were reviewed by me and considered in my medical decision making (see chart for details).     Closed superior endplate fracture of the L3 spine  according to the radiology report this is presumed to be acute although she has had no injures. She also has irregularity at L1.  I believe today she is having sciatic nerve involvement. There is some radiation of pain into the right leg with mild swelling and associated numbness and tingling. No concern for cauda equina today.  Tried to get her in with Dr. Lovell Sheehan previous surgeon that did her fusion. They are no longer accepting her insurance. She was advised to go into the [place] [facility]. Spoke with neurosurgery clinic in [place] and they were able to see her March 24th. Recommended that if symptoms worsen sooner that she will need to go to the ER. Pt understanding and agreed.  I am prescribing prednisone taper and flexeril today. I gave her Toradol for pain here today in the clinic.   Final Clinical Impressions(s) / UC Diagnoses   Final diagnoses:  Closed fracture of third lumbar vertebra, unspecified fracture morphology, initial encounter (HCC)  Acute midline low back pain with right-sided sciatica     Discharge Instructions     I have  scheduled you for an appointment to see the spinal specialist.  The appointment is March 24th at 10:00 am The appointment is with the [facility].  I am giving you prednisone for pain and some muscle relaxants. If your symptoms worsen before the appointment you will need to go to the ER.      ED Prescriptions    Medication Sig Dispense Auth. Provider   predniSONE (STERAPRED UNI-PAK 21 TAB) 10 MG (21) TBPK tablet 6 tabs for 1 day, then 5 tabs for 1 das, then 4 tabs for 1 day, then 3 tabs for 1 day, 2 tabs for 1 day, then 1 tab for 1 day 21 tablet Maricella Filyaw A, NP   cyclobenzaprine (FLEXERIL) 5 MG tablet Take 1 tablet (5 mg total) by mouth 3 (three) times daily as needed for muscle spasms. 30 tablet Dahlia Byes A, NP     PDMP not reviewed this encounter.   Dahlia Byes A, NP 07/14/20 1326

## 2020-07-14 NOTE — ED Triage Notes (Signed)
Pt presents today with c/o right lower back pain x 1 week, radiates down right leg.

## 2020-07-14 NOTE — Discharge Instructions (Addendum)
I have scheduled you for an appointment to see the spinal specialist.  The appointment is March 24th at 10:00 am The appointment is with the [facility].  I am giving you prednisone for pain and some muscle relaxants. If your symptoms worsen before the appointment you will need to go to the ER.

## 2020-11-10 ENCOUNTER — Other Ambulatory Visit: Payer: Self-pay

## 2020-11-10 ENCOUNTER — Ambulatory Visit
Admission: RE | Admit: 2020-11-10 | Discharge: 2020-11-10 | Disposition: A | Discharge status: Home / Self Care | Payer: BLUE CROSS/BLUE SHIELD | Source: Ambulatory Visit | Attending: Internal Medicine | Admitting: Internal Medicine

## 2020-11-10 VITALS — BP 119/67 | HR 87 | Temp 99.1°F | Resp 18

## 2020-11-10 DIAGNOSIS — L03116 Cellulitis of left lower limb: Secondary | ICD-10-CM

## 2020-11-10 DIAGNOSIS — L03115 Cellulitis of right lower limb: Secondary | ICD-10-CM

## 2020-11-10 DIAGNOSIS — L0100 Impetigo, unspecified: Secondary | ICD-10-CM | POA: Diagnosis present

## 2020-11-10 DIAGNOSIS — Z112 Encounter for screening for other bacterial diseases: Secondary | ICD-10-CM

## 2020-11-10 DIAGNOSIS — S80212A Abrasion, left knee, initial encounter: Secondary | ICD-10-CM | POA: Diagnosis present

## 2020-11-10 DIAGNOSIS — R609 Edema, unspecified: Secondary | ICD-10-CM

## 2020-11-10 LAB — POCT URINALYSIS DIP (MANUAL ENTRY)
Bilirubin, UA: NEGATIVE
Blood, UA: NEGATIVE
Glucose, UA: NEGATIVE mg/dL
Ketones, POC UA: NEGATIVE mg/dL
Nitrite, UA: NEGATIVE
Protein Ur, POC: NEGATIVE mg/dL
Spec Grav, UA: >=1.03 — AB (ref 1.010–1.025)
Urobilinogen, UA: 1 E.U./dL
pH, UA: 6 (ref 5.0–8.0)

## 2020-11-10 MED ORDER — DOXYCYCLINE HYCLATE 100 MG PO CAPS: 100.0000 mg | ORAL_CAPSULE | Freq: Two times a day (BID) | ORAL | 0 refills | Status: AC

## 2020-11-10 NOTE — ED Provider Notes (Signed)
[facility]    CSN: [id number] Arrival date & time: 11/10/20  1005      History   Chief Complaint Chief Complaint  Patient presents with   Leg Swelling    bilateral    HPI Kimberly Buchanan is a 45 y.o. female who presents with hx of swelling of both her legs x 1 week. She has not been working at all and not been out in the heat. Has been told in the past she had protein in her urine but never had a work up and does not know how bad it was. She denies any current recreational drug use. Had been IV drug user but d/c 5 years ago. Has had abrasions on her shins and had a larger one on her L knee which looked infected to her and she has been cleaning it and is looking better. She denies orthopnea, SOB or chest pain. Is urinating normal.  Has hx of MRSA.   Past Medical History:  Diagnosis Date   Broken jaw (HCC)    Family history of adverse reaction to anesthesia    mom has PONV   Hand swelling    IV drug abuse (HCC)    history of 6 years clean    Patient Active Problem List   Diagnosis Date Noted   Degeneration of lumbar intervertebral disc 01/03/2017   Depression 02/16/2016   Bilateral hand swelling 02/16/2016   History of heroin abuse (HCC) 02/16/2016    Past Surgical History:  Procedure Laterality Date   MANDIBLE FRACTURE SURGERY      OB History   No obstetric history on file.      Home Medications    Prior to Admission medications   Medication Sig Start Date End Date Taking? Authorizing Provider  doxycycline (VIBRAMYCIN) 100 MG capsule Take 1 capsule (100 mg total) by mouth 2 (two) times daily. 11/10/20  Yes Rodriguez-Southworth, Nettie Elm, PA-C  b complex vitamins tablet Take 1 tablet daily by mouth.    [provider]  Biotin 1000 MCG tablet Take 1,000 mcg by mouth daily.    [provider]  CALCIUM-MAGNESIUM PO Take 1 tablet by mouth at bedtime.    [provider]  cholecalciferol (VITAMIN D) 1000 units tablet Take 1,000  Units daily by mouth.    [provider]  Magnesium 500 MG TABS Take 1,000 mg by mouth daily.     [provider]  mupirocin ointment (BACTROBAN) 2 % Apply three times a day for 7 days. 04/01/19   Renford Dills, NP  chlorthalidone (HYGROTON) 25 MG tablet Take 1 tablet (25 mg total) by mouth daily. 11/19/17 04/01/19  Lorre Munroe, NP    Family History Family History  Problem Relation Age of Onset   Hypertension Mother    Arthritis Maternal Grandmother    Cancer Maternal Grandmother        lung   Hypertension Maternal Grandfather     Social History Social History   Tobacco Use   Smoking status: Former    Pack years: 0.00    Types: Cigarettes    Quit date: 07/25/2015    Years since quitting: 5.3   Smokeless tobacco: Never  Vaping Use   Vaping Use: Every day   Start date: 07/24/2016  Substance Use Topics   Alcohol use: No   Drug use: Not Currently    Types: Heroin     Allergies   Sulfa antibiotics and Bactrim [sulfamethoxazole-trimethoprim]   Review of Systems Review  of Systems  Constitutional:  Positive for fatigue. Negative for chills, diaphoresis and fever.  Respiratory:  Negative for cough, chest tightness and shortness of breath.   Cardiovascular:  Positive for leg swelling. Negative for chest pain.  Musculoskeletal:  Negative for gait problem and joint swelling.  Skin:  Positive for color change and wound. Negative for pallor.  Neurological:  Negative for weakness.  Hematological:  Negative for adenopathy.    Physical Exam Triage Vital Signs ED Triage Vitals  Enc Vitals Group     BP 11/10/20 1017 119/67     Pulse Rate 11/10/20 1017 87     Resp 11/10/20 1017 18     Temp 11/10/20 1017 99.1 F (37.3 C)     Temp Source 11/10/20 1017 Oral     SpO2 11/10/20 1017 95 %     Weight --      Height --      Head Circumference --      Peak Flow --      Pain Score 11/10/20 1020 0     Pain Loc --      Pain Edu? --      Excl. in GC? --    No  data found.  Updated Vital Signs BP 119/67 (BP Location: Left Arm)   Pulse 87   Temp 99.1 F (37.3 C) (Oral)   Resp 18   SpO2 95%   Visual Acuity Right Eye Distance:   Left Eye Distance:   Bilateral Distance:    Right Eye Near:   Left Eye Near:    Bilateral Near:     Physical Exam Vitals and nursing note reviewed.  Constitutional:      General: She is not in acute distress.    Appearance: She is not toxic-appearing.  HENT:     Right Ear: External ear normal.     Left Ear: External ear normal.  Eyes:     General: No scleral icterus.    Conjunctiva/sclera: Conjunctivae normal.  Cardiovascular:     Rate and Rhythm: Normal rate and regular rhythm.     Heart sounds: No murmur heard. Pulmonary:     Effort: Pulmonary effort is normal.     Breath sounds: Normal breath sounds.  Musculoskeletal:        General: Normal range of motion.     Cervical back: Neck supple.  Skin:    General: Skin is warm and dry.     Comments: Has several circular scabs on her shings L>R with quarter size one on her L knee which looks like is healing fine and there is no erythema around it. She has erythema and warmth of L lower anterior leg. Has +3/4 pitting edema of feet to below knee bilaterally.  She also has circular scab on L elbow with erythema and warmth  Neurological:     Mental Status: She is alert and oriented to person, place, and time.     Motor: No weakness.     Gait: Gait normal.  Psychiatric:        Mood and Affect: Mood normal.        Behavior: Behavior normal.        Thought Content: Thought content normal.        Judgment: Judgment normal.     UC Treatments / Results  Labs (all labs ordered are listed, but only abnormal results are displayed) Labs Reviewed  POCT URINALYSIS DIP (MANUAL ENTRY) - Abnormal; Notable for the following components:  Result Value   Spec Grav, UA >=1.030 (*)    Leukocytes, UA Small (1+) (*)    All other components within normal limits   URINE CULTURE    EKG   Radiology No results found.  Procedures Procedures (including critical care time)  Medications Ordered in UC Medications - No data to display  Initial Impression / Assessment and Plan / UC Course  I have reviewed the triage vital signs and the nursing notes. Pertinent labs ts that were available during my care of the patient were reviewed by me and considered in my medical decision making (see chart for details). Has impetigo with cellulitis and edema of lower leg.  I placed her on Doxy and told if edema not improved or resolved in 3-5 days, she needs to go to ER to have labs done to check her renal function.   Final Clinical Impressions(s) / UC Diagnoses   Final diagnoses:  Cellulitis of left lower leg  Cellulitis of right lower extremity  Edema, unspecified type     Discharge Instructions      Your urine does not show any protein, but has a few white cells and I will send it for a culture to check for infection. In the mean time I will treat the skin infection in the hopes the swelling will go away in the next 3-5 days. If it does not go away, please go see your primary care doctor or ER to have lab work to check your kidney function.      ED Prescriptions     Medication Sig Dispense Auth. Provider   doxycycline (VIBRAMYCIN) 100 MG capsule Take 1 capsule (100 mg total) by mouth 2 (two) times daily. 20 capsule Rodriguez-Southworth, Nettie Elm, PA-C      PDMP not reviewed this encounter.   Garey Ham, PA-C 11/10/20 1348

## 2020-11-10 NOTE — ED Triage Notes (Signed)
Pt presents today with c/o of swelling to BLE x 1 wk. Denies injuries.

## 2020-11-10 NOTE — Discharge Instructions (Addendum)
Your urine does not show any protein, but has a few white cells and I will send it for a culture to check for infection. In the mean time I will treat the skin infection in the hopes the swelling will go away in the next 3-5 days. If it does not go away, please go see your primary care doctor or ER to have lab work to check your kidney function.

## 2020-11-11 LAB — URINE CULTURE
Culture: NO GROWTH
Special Requests: NORMAL

## 2023-06-24 ENCOUNTER — Other Ambulatory Visit: Payer: Self-pay | Admitting: Nurse Practitioner

## 2023-06-24 DIAGNOSIS — Z1231 Encounter for screening mammogram for malignant neoplasm of breast: Secondary | ICD-10-CM

## 2023-07-26 ENCOUNTER — Encounter: Payer: Self-pay | Admitting: Internal Medicine

## 2023-08-22 ENCOUNTER — Encounter
